# Patient Record
Sex: Female | Born: 1990 | Race: White | Hispanic: No | Marital: Married | Smoking: Former smoker
Health system: Southern US, Community
[De-identification: ages and names within clinical notes are randomized; demographics above are authoritative.]

## PROBLEM LIST (undated history)

## (undated) DIAGNOSIS — J45909 Unspecified asthma, uncomplicated: Secondary | ICD-10-CM

## (undated) DIAGNOSIS — A770 Spotted fever due to Rickettsia rickettsii: Secondary | ICD-10-CM

## (undated) DIAGNOSIS — T781XXA Other adverse food reactions, not elsewhere classified, initial encounter: Secondary | ICD-10-CM

## (undated) DIAGNOSIS — T7819XA Other adverse food reactions, not elsewhere classified, initial encounter: Secondary | ICD-10-CM

## (undated) HISTORY — DX: Spotted fever due to Rickettsia rickettsii: A77.0

## (undated) HISTORY — DX: Other adverse food reactions, not elsewhere classified, initial encounter: T78.19XA

## (undated) HISTORY — DX: Other adverse food reactions, not elsewhere classified, initial encounter: T78.1XXA

## (undated) HISTORY — DX: Unspecified asthma, uncomplicated: J45.909

---
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
## 2011-08-03 ENCOUNTER — Ambulatory Visit (INDEPENDENT_AMBULATORY_CARE_PROVIDER_SITE_OTHER): Payer: PRIVATE HEALTH INSURANCE | Admitting: Internal Medicine

## 2011-08-03 ENCOUNTER — Encounter: Payer: Self-pay | Admitting: Internal Medicine

## 2011-08-03 DIAGNOSIS — J45990 Exercise induced bronchospasm: Secondary | ICD-10-CM | POA: Insufficient documentation

## 2011-08-03 DIAGNOSIS — J45909 Unspecified asthma, uncomplicated: Secondary | ICD-10-CM

## 2011-08-03 DIAGNOSIS — N943 Premenstrual tension syndrome: Secondary | ICD-10-CM

## 2011-08-03 MED ORDER — MONTELUKAST SODIUM 10 MG PO TABS: 10.0000 mg | ORAL_TABLET | Freq: Every day | ORAL | Status: DC

## 2011-08-03 MED ORDER — METHOCARBAMOL 500 MG PO TABS: 500.0000 mg | ORAL_TABLET | Freq: Four times a day (QID) | ORAL | Status: AC | PRN

## 2011-08-03 MED ORDER — BUDESONIDE-FORMOTEROL FUMARATE 160-4.5 MCG/ACT IN AERO: 2.0000 | INHALATION_SPRAY | Freq: Two times a day (BID) | RESPIRATORY_TRACT | Status: DC

## 2011-08-03 MED ORDER — PEAK FLOW METER DEVI: 1.0000 | Freq: Every day | Status: DC

## 2011-08-03 NOTE — Assessment & Plan Note (Signed)
With constipation, bloating followed by diarrhea and severe cramping for one day.  Recommended trial of Vitamin E, fiber laxatives.

## 2011-08-03 NOTE — Progress Notes (Signed)
Patient ID: Robin Blankenship, female   DOB: Jul 05, 1990, 21 y.o.   MRN: [id number]   Patient Active Problem List  Diagnoses  . Asthma  . Premenstrual syndrome    Subjective:  CC:   Chief Complaint  Patient presents with  . Asthma  . Follow-up    HPI:   Robin Elliottis a 21 y.o. female who presents for follow up on asthma, premenstrual dysphoria and back pain.  Studying business law,  Taking 2 courses at [employer] workload manageable. 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  Some back pain ,  Seeing  chiropractor Dr. Tobe Sos)  For manipulation and PT.  No history of injury or or scoliosis. Cc Menstrual pain, not every period, but every 2 or 3 times get diarrhea, nausea and horrible pain for 1-2 days.  Chronic issue.    2nd issue is asthma,  No prior PFTs .  Symptoms are getting worse, since the winter has used inhaler 30 times in the last month    No past medical history on file.  No past surgical history on file.       The following portions of the patient's history were reviewed and updated as appropriate: Allergies, current medications, and problem list.    Review of Systems:   12 Pt  review of systems was negative except those addressed in the HPI,     History   Social History  . Marital Status: Single    Spouse Name: N/A    Number of Children: N/A  . Years of Education: N/A   Occupational History  . Not on file.   Social History Main Topics  . Smoking status: Current Some Day Smoker    Types: Cigarettes  . Smokeless tobacco: Never Used  . Alcohol Use: Yes  . Drug Use: No  . Sexually Active: Not on file   Other Topics Concern  . Not on file   Social History Narrative  . No narrative on file    Objective:  BP 96/58  Pulse 80  Temp(Src) 98 F (36.7 C) (Oral)  Resp 16  Ht 5\' 4"  (1.626 m)  Wt 115 lb (52.164 kg)  BMI 19.74 kg/m2  SpO2 97%  LMP 07/26/2011  General appearance: alert, cooperative and appears stated age Ears: normal  TM's and external ear canals both ears Throat: lips, mucosa, and tongue normal; teeth and gums normal Neck: no adenopathy, no carotid bruit, supple, symmetrical, trachea midline and thyroid not enlarged, symmetric, no tenderness/mass/nodules Back: symmetric, no curvature. ROM normal. No CVA tenderness. Lungs: clear to auscultation bilaterally Heart: regular rate and rhythm, S1, S2 normal, no murmur, click, rub or gallop Abdomen: soft, non-tender; bowel sounds normal; no masses,  no organomegaly Pulses: 2+ and symmetric Skin: Skin color, texture, turgor normal. No rashes or lesions Lymph nodes: Cervical, supraclavicular, and axillary nodes normal.  Assessment and Plan:  Asthma Patient asymptomatic currently, ,with normal lung exam. But given recurrent use of albuterol I am Starting Symbicort and Singulair for  increased symptoms. Peak flow meter prescribed.   Premenstrual syndrome With constipation, bloating followed by diarrhea and severe cramping for one day.  Recommended trial of Vitamin E, fiber laxatives.     Updated Medication List Outpatient Encounter Prescriptions as of 08/03/2011  Medication Sig Dispense Refill  . albuterol (PROVENTIL HFA;VENTOLIN HFA) 108 (90 BASE) MCG/ACT inhaler Inhale 2 puffs into the lungs every 6 (six) hours as needed.      Marland Kitchen BIOTIN 5000 PO Take by mouth.      Marland Kitchen  budesonide-formoterol (SYMBICORT) 160-4.5 MCG/ACT inhaler Inhale 2 puffs into the lungs 2 (two) times daily.  1 Inhaler  12  . methocarbamol (ROBAXIN) 500 MG tablet Take 1 tablet (500 mg total) by mouth every 6 (six) hours as needed.  30 tablet  1  . montelukast (SINGULAIR) 10 MG tablet Take 1 tablet (10 mg total) by mouth at bedtime.  30 tablet  3  . Peak Flow Meter DEVI 1 Device by Does not apply route daily.  1 each  0     No orders of the defined types were placed in this encounter.    No Follow-up on file.      '

## 2011-08-03 NOTE — Assessment & Plan Note (Signed)
Patient asymptomatic currently, ,with normal lung exam. But given recurrent use of albuterol I am Starting Symbicort and Singulair for  increased symptoms. Peak flow meter prescribed.

## 2011-08-03 NOTE — Patient Instructions (Addendum)
I recommend starting citrucel one week before your period .  Take at bedtime .  It is a gentle fiber laxative and works overnight.    For the terrible days of cramping you can try a muscle relaxer when you are at home.  Robaxin (methocarbamol)  1/2 tablet every 6 hours as needed.    You can try taking 400 units of Vitamin E (some gyn docs recommend this)  For your asthma:  I am startgin two new mewds to prevent attacks:  Singulair : one pill daily  (for asthma and allergies  Symbicort:  2 puffs twice daily (to prevent astham attacks)  As your maintenance inhaler   I would like you to start to check your peak flows once daily in the morning to help monitor you for signs of impending asthma exacerbation

## 2011-09-20 ENCOUNTER — Encounter: Payer: Self-pay | Admitting: Internal Medicine

## 2011-09-20 ENCOUNTER — Ambulatory Visit (INDEPENDENT_AMBULATORY_CARE_PROVIDER_SITE_OTHER): Payer: BC Managed Care – PPO | Admitting: Internal Medicine

## 2011-09-20 DIAGNOSIS — L0591 Pilonidal cyst without abscess: Secondary | ICD-10-CM

## 2011-09-20 MED ORDER — BUDESONIDE-FORMOTEROL FUMARATE 160-4.5 MCG/ACT IN AERO: 2.0000 | INHALATION_SPRAY | Freq: Two times a day (BID) | RESPIRATORY_TRACT | Status: DC

## 2011-09-20 MED ORDER — SULFAMETHOXAZOLE-TRIMETHOPRIM 800-160 MG PO TABS: 1.0000 | ORAL_TABLET | Freq: Two times a day (BID) | ORAL | Status: AC

## 2011-09-20 NOTE — Patient Instructions (Signed)
Pilonidal Cyst A pilonidal cyst occurs when hairs get trapped (ingrown) beneath the skin in the crease between the buttocks over your sacrum (the bone under that crease). Pilonidal cysts are most common in young men with a lot of body hair. When the cyst is ruptured (breaks) or leaking, fluid from the cyst may cause burning and itching. If the cyst becomes infected, it causes a painful swelling filled with pus (abscess). The pus and trapped hairs need to be removed (often by lancing) so that the infection can heal. However, recurrence is common and an operation may be needed to remove the cyst. HOME CARE INSTRUCTIONS   If the cyst was NOT INFECTED:   Keep the area clean and dry. Bathe or shower daily. Wash the area well with a germ-killing soap. Warm tub baths may help prevent infection and help with drainage. Dry the area well with a towel.   Avoid tight clothing to keep area as moisture free as possible.   Keep area between buttocks as free of hair as possible. A depilatory may be used.   If the cyst WAS INFECTED and needed to be drained:   Your caregiver packed the wound with gauze to keep the wound open. This allows the wound to heal from the inside outwards and continue draining.   Return for a wound check in 1 day or as suggested.   If you take tub baths or showers, repack the wound with gauze following them. Sponge baths (at the sink) are a good alternative.   If an antibiotic was ordered to fight the infection, take as directed.   Only take over-the-counter or prescription medicines for pain, discomfort, or fever as directed by your caregiver.   After the drain is removed, use sitz baths for 20 minutes 4 times per day. Clean the wound gently with mild unscented soap, pat dry, and then apply a dry dressing.  SEEK MEDICAL CARE IF:   You have increased pain, swelling, redness, drainage, or bleeding from the area.   You have a fever.   You have muscles aches, dizziness, or a  general ill feeling.  Document Released: 04/16/2000 Document Revised: 04/08/2011 Document Reviewed: 06/14/2008 Exit[employer] Patient Information 2012 [employer]. 

## 2011-09-21 DIAGNOSIS — L0591 Pilonidal cyst without abscess: Secondary | ICD-10-CM | POA: Insufficient documentation

## 2011-09-21 NOTE — Assessment & Plan Note (Signed)
There are no signs of abscess  So there is no indication for antibiotics. But will give her an rx for future use since he is going out of town. Referral to Dr. Lemar Livings .

## 2011-09-21 NOTE — Progress Notes (Signed)
Patient ID: Robin Blankenship, female   DOB: 24-Jun-1990, 21 y.o.   MRN: [id number] Patient Active Problem List  Diagnoses  . Asthma  . Premenstrual syndrome  . Pilonidal cyst    Subjective:  CC:   Chief Complaint  Patient presents with  . cyst at end of spine not draining    HPI:   Robin Blankenship a 21 y.o. female who presents  Swelling and tenderness at the base of her spine , which started bout four days ago after a long car ride. Marland Kitchen  History of prior pilonidal cyst, excised years ago by dr. Kennyth Arnold.  No redness , drainage or systemic symptoms.   No past medical history on file.  No past surgical history on file.       The following portions of the patient's history were reviewed and updated as appropriate: Allergies, current medications, and problem list.    Review of Systems:   12 Pt  review of systems was negative except those addressed in the HPI,     History   Social History  . Marital Status: Single    Spouse Name: N/A    Number of Children: N/A  . Years of Education: N/A   Occupational History  . Not on file.   Social History Main Topics  . Smoking status: Former Smoker    Types: Cigarettes  . Smokeless tobacco: Former Neurosurgeon    Quit date: 09/18/2011  . Alcohol Use: Yes  . Drug Use: No  . Sexually Active: Not on file   Other Topics Concern  . Not on file   Social History Narrative  . No narrative on file    Objective:  BP 117/75  Pulse 78  Temp(Src) 98.9 F (37.2 C) (Oral)  Resp 20  Ht 5\' 4"  (1.626 m)  Wt 109 lb 12 oz (49.782 kg)  BMI 18.84 kg/m2  SpO2 100%  General appearance: alert, cooperative and appears stated age Ears: normal TM's and external ear canals both ears Throat: lips, mucosa, and tongue normal; teeth and gums normal Neck: no adenopathy, no carotid bruit, supple, symmetrical, trachea midline and thyroid not enlarged, symmetric, no tenderness/mass/nodules Back: symmetric, no curvature. ROM normal. No CVA  tenderness. Lungs: clear to auscultation bilaterally Heart: regular rate and rhythm, S1, S2 normal, no murmur, click, rub or gallop Abdomen: soft, non-tender; bowel sounds normal; no masses,  no organomegaly Rectal: perirectal swelling Pulses: 2+ and symmetric Skin: Skin color, texture, turgor normal. No rashes or lesions Lymph nodes: Cervical, supraclavicular, and axillary nodes normal.  Assessment and Plan:  Pilonidal cyst There are no signs of abscess  So there is no indication for antibiotics. But will give her an rx for future use since he is going out of town. Referral to Dr. Lemar Livings .     Updated Medication List Outpatient Encounter Prescriptions as of 09/20/2011  Medication Sig Dispense Refill  . albuterol (PROVENTIL HFA;VENTOLIN HFA) 108 (90 BASE) MCG/ACT inhaler Inhale 2 puffs into the lungs every 6 (six) hours as needed.      Marland Kitchen BIOTIN 5000 PO Take by mouth.      . montelukast (SINGULAIR) 10 MG tablet Take 1 tablet (10 mg total) by mouth at bedtime.  30 tablet  3  . Peak Flow Meter DEVI 1 Device by Does not apply route daily.  1 each  0  . budesonide-formoterol (SYMBICORT) 160-4.5 MCG/ACT inhaler Inhale 2 puffs into the lungs 2 (two) times daily.  1 Inhaler  12  . sulfamethoxazole-trimethoprim (BACTRIM  DS,SEPTRA DS) 800-160 MG per tablet Take 1 tablet by mouth 2 (two) times daily.  20 tablet  0  . DISCONTD: budesonide-formoterol (SYMBICORT) 160-4.5 MCG/ACT inhaler Inhale 2 puffs into the lungs 2 (two) times daily.  1 Inhaler  12     Orders Placed This Encounter  Procedures  . Ambulatory referral to General Surgery    No Follow-up on file.

## 2012-07-24 ENCOUNTER — Ambulatory Visit (INDEPENDENT_AMBULATORY_CARE_PROVIDER_SITE_OTHER): Payer: 59 | Admitting: Internal Medicine

## 2012-07-24 ENCOUNTER — Encounter: Payer: Self-pay | Admitting: Internal Medicine

## 2012-07-24 VITALS — BP 114/70 | HR 66 | Temp 98.6°F | Resp 16 | Wt 116.5 lb

## 2012-07-24 DIAGNOSIS — N943 Premenstrual tension syndrome: Secondary | ICD-10-CM

## 2012-07-24 DIAGNOSIS — R5383 Other fatigue: Secondary | ICD-10-CM

## 2012-07-24 DIAGNOSIS — Z13220 Encounter for screening for lipoid disorders: Secondary | ICD-10-CM

## 2012-07-24 DIAGNOSIS — Z309 Encounter for contraceptive management, unspecified: Secondary | ICD-10-CM

## 2012-07-24 DIAGNOSIS — R5381 Other malaise: Secondary | ICD-10-CM

## 2012-07-24 MED ORDER — NORGESTREL-ETHINYL ESTRADIOL 0.3-30 MG-MCG PO TABS: 1.0000 | ORAL_TABLET | Freq: Every day | ORAL | Status: DC

## 2012-07-24 NOTE — Progress Notes (Signed)
Patient ID: Robin Blankenship, female   DOB: 05/10/1990, 22 y.o.   MRN: [id number]   Patient Active Problem List  Diagnosis  . Asthma  . Premenstrual syndrome  . Pilonidal cyst  . Contraceptive management    Subjective:  CC:   Chief Complaint  Patient presents with  . Contraceptives    Discuss Birth Control    HPI:   Robin Elliottis a 22 y.o. female who presents To discuss oral contraception. She is currently is sexually active, in a monogamous relationship, using contraception and wants to discuss choices of birth control. She had previously used oral contraceptives as a way to regulate her periods and to reduce her pain from fall follicular maturation. She was not sexually active at the previous trial. Currently she is going to school and working full-time. 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  She is no history of tobacco use or blood clots. There is no family history of stroke or blood clots either.     History reviewed. No pertinent past medical history.  History reviewed. No pertinent past surgical history.     The following portions of the patient's history were reviewed and updated as appropriate: Allergies, current medications, and problem list.    Review of Systems:   12 Pt  review of systems was negative except those addressed in the HPI,     History   Social History  . Marital Status: Single    Spouse Name: N/A    Number of Children: N/A  . Years of Education: N/A   Occupational History  . Not on file.   Social History Main Topics  . Smoking status: Former Smoker    Types: Cigarettes  . Smokeless tobacco: Former Neurosurgeon    Quit date: 09/18/2011  . Alcohol Use: Yes  . Drug Use: No  . Sexually Active: Not on file   Other Topics Concern  . Not on file   Social History Narrative  . No narrative on file    Objective:  BP 114/70  Pulse 66  Temp(Src) 98.6 F (37 C) (Oral)  Resp 16  Wt 116 lb 8 oz  (52.844 kg)  BMI 19.99 kg/m2  SpO2 98%  LMP 07/19/2012  General appearance: alert, cooperative and appears stated age Ears: normal TM's and external ear canals both ears Throat: lips, mucosa, and tongue normal; teeth and gums normal Neck: no adenopathy, no carotid bruit, supple, symmetrical, trachea midline and thyroid not enlarged, symmetric, no tenderness/mass/nodules Back: symmetric, no curvature. ROM normal. No CVA tenderness. Lungs: clear to auscultation bilaterally Heart: regular rate and rhythm, S1, S2 normal, no murmur, click, rub or gallop Abdomen: soft, non-tender; bowel sounds normal; no masses,  no organomegaly Pulses: 2+ and symmetric Skin: Skin color, texture, turgor normal. No rashes or lesions Lymph nodes: Cervical, supraclavicular, and axillary nodes normal.  Assessment and Plan:  Premenstrual syndrome She has significant premenstrual pain due to follicular maturation. We discussed starting birth control as a way to help this.  Contraceptive management Education given regarding options for contraception, including barrier methods, injectable contraception, oral contraceptives..  patient has a son she is on contraceptives. She is advised to continue to use barrier contraceptives for the first month and whenever she is on antibiotics for remainder of that month.   Updated Medication List Outpatient Encounter Prescriptions as of 07/24/2012  Medication Sig Dispense Refill  . albuterol (PROVENTIL HFA;VENTOLIN HFA) 108 (90 BASE) MCG/ACT inhaler Inhale 2 puffs into the lungs every 6 (six)  hours as needed.      Marland Kitchen BIOTIN 5000 PO Take by mouth.      . budesonide-formoterol (SYMBICORT) 160-4.5 MCG/ACT inhaler Inhale 2 puffs into the lungs 2 (two) times daily.  1 Inhaler  12  . Peak Flow Meter DEVI 1 Device by Does not apply route daily.  1 each  0  . montelukast (SINGULAIR) 10 MG tablet Take 1 tablet (10 mg total) by mouth at bedtime.  30 tablet  3  . norgestrel-ethinyl  estradiol (LO/OVRAL,CRYSELLE) 0.3-30 MG-MCG tablet Take 1 tablet by mouth daily.  1 Package  11   No facility-administered encounter medications on file as of 07/24/2012.     Orders Placed This Encounter  Procedures  . CBC with Differential  . Comprehensive metabolic panel  . TSH  . Lipid panel    No Follow-up on file.

## 2012-07-24 NOTE — Patient Instructions (Addendum)
Return for labs this week before you start the birth control.    We will need to recheck your liver enzymes after a few months to make sure the pills have not addected your liver.

## 2012-07-25 ENCOUNTER — Encounter: Payer: Self-pay | Admitting: Internal Medicine

## 2012-07-25 DIAGNOSIS — Z309 Encounter for contraceptive management, unspecified: Secondary | ICD-10-CM | POA: Insufficient documentation

## 2012-07-25 NOTE — Assessment & Plan Note (Signed)
Education given regarding options for contraception, including barrier methods, injectable contraception, oral contraceptives..  patient has a son she is on contraceptives. She is advised to continue to use barrier contraceptives for the first month and whenever she is on antibiotics for remainder of that month.

## 2012-07-25 NOTE — Assessment & Plan Note (Signed)
She has significant premenstrual pain due to follicular maturation. We discussed starting birth control as a way to help this.

## 2012-07-26 ENCOUNTER — Other Ambulatory Visit (INDEPENDENT_AMBULATORY_CARE_PROVIDER_SITE_OTHER): Payer: 59

## 2012-07-26 ENCOUNTER — Encounter: Payer: Self-pay | Admitting: Internal Medicine

## 2012-07-26 DIAGNOSIS — R5381 Other malaise: Secondary | ICD-10-CM

## 2012-07-26 DIAGNOSIS — Z13220 Encounter for screening for lipoid disorders: Secondary | ICD-10-CM

## 2012-07-26 DIAGNOSIS — R5383 Other fatigue: Secondary | ICD-10-CM

## 2012-07-26 LAB — COMPREHENSIVE METABOLIC PANEL
Albumin: 4.5 g/dL (ref 3.5–5.2)
Alkaline Phosphatase: 59 U/L (ref 39–117)
BUN: 12 mg/dL (ref 6–23)
CO2: 24 mEq/L (ref 19–32)
GFR: 94.73 mL/min (ref >60.00)
Glucose, Bld: 86 mg/dL (ref 70–99)
Potassium: 4.1 mEq/L (ref 3.5–5.1)
Total Bilirubin: 1 mg/dL (ref 0.3–1.2)

## 2012-07-26 LAB — CBC WITH DIFFERENTIAL/PLATELET
Basophils Relative: 0.8 % (ref 0.0–3.0)
Eosinophils Relative: 3.9 % (ref 0.0–5.0)
HCT: 40.9 % (ref 36.0–46.0)
Hemoglobin: 14 g/dL (ref 12.0–15.0)
Lymphs Abs: 1.3 10*3/uL (ref 0.7–4.0)
MCV: 90.2 fl (ref 78.0–100.0)
Monocytes Absolute: 0.4 10*3/uL (ref 0.1–1.0)
Monocytes Relative: 8 % (ref 3.0–12.0)
RBC: 4.53 Mil/uL (ref 3.87–5.11)
WBC: 4.5 10*3/uL (ref 4.5–10.5)

## 2012-07-26 LAB — LIPID PANEL
Cholesterol: 186 mg/dL (ref 0–200)
VLDL: 7.4 mg/dL (ref 0.0–40.0)

## 2012-07-26 LAB — TSH: TSH: 1.2 u[IU]/mL (ref 0.35–5.50)

## 2012-07-28 ENCOUNTER — Encounter: Payer: Self-pay | Admitting: General Practice

## 2012-08-22 ENCOUNTER — Encounter: Payer: Self-pay | Admitting: Internal Medicine

## 2013-03-08 ENCOUNTER — Other Ambulatory Visit: Payer: Self-pay

## 2013-10-05 ENCOUNTER — Ambulatory Visit: Payer: 59 | Admitting: Internal Medicine

## 2013-10-22 ENCOUNTER — Ambulatory Visit (INDEPENDENT_AMBULATORY_CARE_PROVIDER_SITE_OTHER): Payer: Self-pay | Admitting: Internal Medicine

## 2013-10-22 DIAGNOSIS — Z0000 Encounter for general adult medical examination without abnormal findings: Secondary | ICD-10-CM

## 2013-10-23 DIAGNOSIS — Z0000 Encounter for general adult medical examination without abnormal findings: Secondary | ICD-10-CM | POA: Insufficient documentation

## 2013-10-23 NOTE — Progress Notes (Signed)
Patient ID: Robin Blankenship, female   DOB: 1990/12/08, 23 y.o.   MRN: [id number] Patient was scheduled for a 30 minute appt and DNKA/No showed.

## 2013-10-24 ENCOUNTER — Telehealth: Payer: Self-pay | Admitting: Internal Medicine

## 2013-10-24 NOTE — Telephone Encounter (Signed)
Please schedule patient and advise of appointment

## 2013-10-24 NOTE — Telephone Encounter (Signed)
The patient has been scheduled for 6.25.15 @ 11:45.

## 2013-10-24 NOTE — Telephone Encounter (Signed)
NO, I will not use that acute slot for this patient's need. Add her on this  Thursday at 11:45

## 2013-10-24 NOTE — Telephone Encounter (Signed)
Only place you have is an acute 6:15 on Monday, please advise. Patient no showed on 10/22/13

## 2013-10-24 NOTE — Telephone Encounter (Signed)
Patient notified of appointment time.

## 2013-10-24 NOTE — Telephone Encounter (Signed)
I asked the patient why she missed her last appointment and she stated that it totally slipped her mind. Needing a form filled out for college /physical and vaccines before July 1st.

## 2013-10-25 ENCOUNTER — Ambulatory Visit (INDEPENDENT_AMBULATORY_CARE_PROVIDER_SITE_OTHER): Payer: BC Managed Care – PPO | Admitting: Internal Medicine

## 2013-10-25 ENCOUNTER — Encounter: Payer: Self-pay | Admitting: Internal Medicine

## 2013-10-25 VITALS — BP 98/68 | HR 61 | Temp 97.9°F | Resp 16 | Ht 64.0 in | Wt 116.2 lb

## 2013-10-25 DIAGNOSIS — Z0279 Encounter for issue of other medical certificate: Secondary | ICD-10-CM

## 2013-10-25 DIAGNOSIS — Z0000 Encounter for general adult medical examination without abnormal findings: Secondary | ICD-10-CM

## 2013-10-25 DIAGNOSIS — Z112 Encounter for screening for other bacterial diseases: Secondary | ICD-10-CM

## 2013-10-25 DIAGNOSIS — Z118 Encounter for screening for other infectious and parasitic diseases: Secondary | ICD-10-CM

## 2013-10-25 NOTE — Progress Notes (Signed)
Pre-visit discussion using our clinic review tool. No additional management support is needed unless otherwise documented below in the visit note.  

## 2013-10-25 NOTE — Progress Notes (Signed)
Patient ID: Robin Blankenship, female   DOB: 05/02/1991, 23 y.o.   MRN: [id number]  Patient Active Problem List   Diagnosis Date Noted  . Routine general medical examination at a health care facility 10/23/2013  . Contraceptive management 07/25/2012  . Pilonidal cyst 09/21/2011  . Asthma 08/03/2011  . Premenstrual syndrome 08/03/2011    Subjective:  CC:   Chief Complaint  Patient presents with  . Follow-up    forms for school    HPI:   Robin Blankenship is a 23 y.o. female who presents for   Need for medical history and immunization confirmation. 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  Does not have her immunization records with her or available in chart. Pediatrician was Dr Stacey Drain    No past medical history on file.  No past surgical history on file.     The following portions of the patient's history were reviewed and updated as appropriate: Allergies, current medications, and problem list.    Review of Systems:   Patient denies headache, fevers, malaise, unintentional weight loss, skin rash, eye pain, sinus congestion and sinus pain, sore throat, dysphagia,  hemoptysis , cough, dyspnea, wheezing, chest pain, palpitations, orthopnea, edema, abdominal pain, nausea, melena, diarrhea, constipation, flank pain, dysuria, hematuria, urinary  Frequency, nocturia, numbness, tingling, seizures,  Focal weakness, Loss of consciousness,  Tremor, insomnia, depression, anxiety, and suicidal ideation.     History   Social History  . Marital Status: Single    Spouse Name: N/A    Number of Children: N/A  . Years of Education: N/A   Occupational History  . Not on file.   Social History Main Topics  . Smoking status: Former Smoker    Types: Cigarettes  . Smokeless tobacco: Former Systems developer    Quit date: 09/18/2011  . Alcohol Use: Yes  . Drug Use: No  . Sexual Activity: Not  on file   Other Topics Concern  . Not on file   Social History Narrative  . No narrative on file    Objective:  Filed Vitals:   10/25/13 1159  BP: 98/68  Pulse: 61  Temp: 97.9 F (36.6 C)  Resp: 16     General appearance: alert, cooperative and appears stated age Ears: normal TM's and external ear canals both ears Throat: lips, mucosa, and tongue normal; teeth and gums normal Neck: no adenopathy, no carotid bruit, supple, symmetrical, trachea midline and thyroid not enlarged, symmetric, no tenderness/mass/nodules Back: symmetric, no curvature. ROM normal. No CVA tenderness. Lungs: clear to auscultation bilaterally Heart: regular rate and rhythm, S1, S2 normal, no murmur, click, rub or gallop Abdomen: soft, non-tender; bowel sounds normal; no masses,  no organomegaly Pulses: 2+ and symmetric Skin: Skin color, texture, turgor normal. No rashes or lesions Lymph nodes: Cervical, supraclavicular, and axillary nodes normal.  Assessment and Plan:  Routine general medical examination at a health care facility Annual comprehensive exam was done excluding breast, pelvic and PAP smear. All immunizations have been reviewed and screenings have been addressed .   A total of 25 minutes was spent with patient more than half of which was spent in counseling, reviewing records from other prviders and coordination of care.   Updated Medication List Outpatient Encounter Prescriptions as of 10/25/2013  Medication Sig  . albuterol (PROVENTIL HFA;VENTOLIN HFA) 108 (90 BASE) MCG/ACT inhaler Inhale 2 puffs into the lungs every 6 (six) hours  as needed.  . Peak Flow Meter DEVI 1 Device by Does not apply route daily.  Marland Kitchen BIOTIN 5000 PO Take by mouth.  . budesonide-formoterol (SYMBICORT) 160-4.5 MCG/ACT inhaler Inhale 2 puffs into the lungs 2 (two) times daily.  . montelukast (SINGULAIR) 10 MG tablet Take 1 tablet (10 mg total) by mouth at bedtime.  . norgestrel-ethinyl estradiol  (LO/OVRAL,CRYSELLE) 0.3-30 MG-MCG tablet Take 1 tablet by mouth daily.     Orders Placed This Encounter  Procedures  . Quantiferon tb gold assay    No Follow-up on file.

## 2013-10-27 NOTE — Assessment & Plan Note (Signed)
Annual comprehensive exam was done excluding breast, pelvic and PAP smear. All immunizations have been reviewed and screenings have been addressed .

## 2013-10-29 ENCOUNTER — Telehealth: Payer: Self-pay | Admitting: Internal Medicine

## 2013-10-29 LAB — QUANTIFERON TB GOLD ASSAY (BLOOD)
Interferon Gamma Release Assay: NEGATIVE
Mitogen value: 8.14 IU/mL
QUANTIFERON NIL VALUE: 0.02 [IU]/mL
Quantiferon Tb Ag Minus Nil Value: 0 IU/mL
TB Ag value: 0.02 IU/mL

## 2013-10-29 NOTE — Telephone Encounter (Signed)
Paperwork waiting on TB test not resulted as of this date.

## 2013-10-30 ENCOUNTER — Encounter: Payer: Self-pay | Admitting: *Deleted

## 2013-10-30 NOTE — Telephone Encounter (Signed)
Added quantitated Gold test to paper work for TB testing faxed to college and notified patient ready for pick up and copy sent to billing.

## 2014-05-08 ENCOUNTER — Ambulatory Visit: Payer: BC Managed Care – PPO | Admitting: Internal Medicine

## 2014-05-13 ENCOUNTER — Ambulatory Visit (INDEPENDENT_AMBULATORY_CARE_PROVIDER_SITE_OTHER): Payer: 59 | Admitting: Nurse Practitioner

## 2014-05-13 ENCOUNTER — Encounter: Payer: Self-pay | Admitting: Nurse Practitioner

## 2014-05-13 VITALS — BP 118/70 | HR 101 | Temp 97.5°F | Resp 12 | Ht 64.0 in | Wt 119.8 lb

## 2014-05-13 DIAGNOSIS — J45990 Exercise induced bronchospasm: Secondary | ICD-10-CM

## 2014-05-13 DIAGNOSIS — M21612 Bunion of left foot: Principal | ICD-10-CM

## 2014-05-13 DIAGNOSIS — M2012 Hallux valgus (acquired), left foot: Secondary | ICD-10-CM

## 2014-05-13 DIAGNOSIS — M21611 Bunion of right foot: Secondary | ICD-10-CM

## 2014-05-13 DIAGNOSIS — M2011 Hallux valgus (acquired), right foot: Secondary | ICD-10-CM

## 2014-05-13 MED ORDER — ALBUTEROL SULFATE HFA 108 (90 BASE) MCG/ACT IN AERS: 2.0000 | INHALATION_SPRAY | Freq: Four times a day (QID) | RESPIRATORY_TRACT | Status: DC | PRN

## 2014-05-13 NOTE — Patient Instructions (Addendum)
We will contact you about your referral to Podiatry.   The refill for your inhaler was sent to [facility].   Call us if you need anything in the mean time.

## 2014-05-13 NOTE — Assessment & Plan Note (Signed)
Worsening. Painful feet bilaterally. Referral to Dr. Elvina Mattes podiatry.

## 2014-05-13 NOTE — Progress Notes (Signed)
   Subjective:    Patient ID: Robin Blankenship, female    DOB: [birth year], 24 y.o.   MRN: [id number]  HPI  Ms. Robin Blankenship is a 24 yo female with a CC of asthma and painful feet.   1) Asthma, no recent flare ups. Uses albuterol inhaler infrequently and needs a refill since she is planning to start exercising soon.   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 Pt states right foot has pain with walking on the pad and heel; Left foot has arch and toe pad pain with occasional heel pain. She states walking a lot makes it worse. She has extensive family history of the same feet deformities and pain. She is asking for a referral.   Review of Systems  Constitutional: Negative for fever, chills, diaphoresis and fatigue.  Musculoskeletal: Positive for gait problem. Negative for myalgias, joint swelling and arthralgias.       From painful feet when walking a lot  Skin: Negative for color change, pallor, rash and wound.   No past medical history on file.  History   Social History  . Marital Status: Single    Spouse Name: N/A    Number of Children: N/A  . Years of Education: N/A   Occupational History  . Not on file.   Social History Main Topics  . Smoking status: Former Smoker    Types: Cigarettes  . Smokeless tobacco: Former Systems developer    Quit date: 09/18/2011  . Alcohol Use: Yes  . Drug Use: No  . Sexual Activity: Not on file   Other Topics Concern  . Not on file   Social History Narrative    No past surgical history on file.  Family History  Problem Relation Age of Onset  . Heart disease Father   . Cancer Paternal Grandmother     breast   . Diabetes Paternal Grandmother     No Known Allergies  Current Outpatient Prescriptions on File Prior to Visit  Medication Sig Dispense Refill  . BIOTIN 5000 PO Take by mouth.    . Peak Flow Meter DEVI 1 Device by Does not apply route daily. 1 each 0   No current facility-administered medications on file prior to  visit.       Objective:   Physical Exam  Constitutional: She is oriented to person, place, and time. She appears well-developed and well-nourished. No distress.  HENT:  Head: Normocephalic and atraumatic.  Cardiovascular: Normal rate and regular rhythm.   Pulmonary/Chest: Effort normal and breath sounds normal.  Musculoskeletal: She exhibits no edema or tenderness.  Feet have bunions and valgus deformity of large toes. She is not tender on palpation today.   Neurological: She is alert and oriented to person, place, and time.  Skin: Skin is warm and dry. No rash noted. She is not diaphoretic.  Psychiatric: She has a normal mood and affect. Her behavior is normal. Judgment and thought content normal.   BP 118/70 mmHg  Pulse 101  Temp(Src) 97.5 F (36.4 C) (Oral)  Resp 12  Ht 5\' 4"  (1.626 m)  Wt 119 lb 12.8 oz (54.341 kg)  BMI 20.55 kg/m2  SpO2 94%  LMP  (Exact Date) Repeat pulse 86    Assessment & Plan:

## 2014-05-13 NOTE — Assessment & Plan Note (Signed)
Stable. 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 Refilled. Normal lung exam. FU prn.

## 2014-11-25 ENCOUNTER — Other Ambulatory Visit: Payer: Self-pay | Admitting: Internal Medicine

## 2014-11-25 NOTE — Telephone Encounter (Signed)
Please advise refill.  Last OV was with NP on 05/13/14 and last OV with you was 10/25/13.

## 2014-11-27 NOTE — Telephone Encounter (Signed)
Ok to refill,  Refill sent  

## 2016-10-12 ENCOUNTER — Encounter: Payer: Self-pay | Admitting: Family

## 2016-10-12 ENCOUNTER — Ambulatory Visit (INDEPENDENT_AMBULATORY_CARE_PROVIDER_SITE_OTHER): Payer: 59 | Admitting: Family

## 2016-10-12 VITALS — BP 104/60 | HR 100 | Temp 98.9°F | Ht 64.0 in | Wt 131.6 lb

## 2016-10-12 DIAGNOSIS — R197 Diarrhea, unspecified: Secondary | ICD-10-CM | POA: Diagnosis not present

## 2016-10-12 NOTE — Progress Notes (Signed)
Subjective:    Patient ID: Robin Blankenship, female    DOB: 06/06/1990, 26 y.o.   MRN: [id number]  CC: Robin Blankenship is a 26 y.o. female who presents today for an acute visit.    HPI: CC: diarrhea, vomiting for 6 days, intermittent. Diarrhea started on trip and then went away on trip. Day when got back, diarrhea came back. Diarrhea is worse in the morning, having 3-4 per day. Non bloody, non mucoid. No recent antibiotics.  Recently had been in [employer] [place].  Has been taking imodium with intermittent relief Endorses loss of appetite, stomach cramps, intermittent chills. Severity of stomach cramps 'has gone down.' Husband has as well however feeling better now.   Has been drinking fluids.       HISTORY:  No past medical history on file. No past surgical history on file. Family History  Problem Relation Age of Onset  . Heart disease Father   . Cancer Paternal Grandmother        breast   . Diabetes Paternal Grandmother     Allergies: Patient has no known allergies. Current Outpatient Prescriptions on File Prior to Visit  Medication Sig Dispense Refill  . albuterol (PROVENTIL,VENTOLIN) 90 MCG/ACT inhaler USE TWO ORAL INHALATIONS EVERY FOUR TO SIX HOURS AS NEEDED BEFORE EXERCISE 17 each 5   No current facility-administered medications on file prior to visit.     Social History  Substance Use Topics  . Smoking status: Former Smoker    Types: Cigarettes  . Smokeless tobacco: Former Systems developer    Quit date: 09/18/2011  . Alcohol use Yes    Review of Systems  Constitutional: Negative for chills and fever.  Respiratory: Negative for cough.   Cardiovascular: Negative for chest pain and palpitations.  Gastrointestinal: Positive for abdominal pain, diarrhea, nausea and vomiting. Negative for abdominal distention, blood in stool and constipation.      Objective:    BP 104/60   Pulse 100   Temp 98.9 F (37.2 C) (Oral)   Ht 5\' 4"  (1.626 m)   Wt 131 lb  9.6 oz (59.7 kg)   LMP 09/25/2016 (Exact Date)   SpO2 98%   BMI 22.59 kg/m    Physical Exam  Constitutional: She appears well-developed and well-nourished.  Eyes: Conjunctivae are normal.  Cardiovascular: Normal rate, regular rhythm, normal heart sounds and normal pulses.   Pulmonary/Chest: Effort normal and breath sounds normal. She has no wheezes. She has no rhonchi. She has no rales.  Abdominal: Soft. Normal appearance and bowel sounds are normal. She exhibits no distension, no fluid wave, no ascites and no mass. There is generalized tenderness. There is no rigidity, no rebound, no guarding and no CVA tenderness.  Generalized mild tenderness, not exquisite. No focal tenderness.   Neurological: She is alert.  Skin: Skin is warm and dry.  Psychiatric: She has a normal mood and affect. Her speech is normal and behavior is normal. Thought content normal.  Vitals reviewed.      Assessment & Plan:   1. Diarrhea, unspecified type Afebrile. Patient is well-appearing. Patient exhibited mild tenderness, generalized over her abdomen which I would anticipate after days of diarrhea. No severe abdominal pain, fever, focal abdominal pain. Working diagnosis of viral gastritis.  No blood, mucous to suggest shigella. Pending stool cultures. Return precautions and red flag symptoms discussed at length with patient. Advised against any antimotility agents.   I have discontinued Robin Blankenship's BIOTIN 5000 PO and Peak Flow Meter.  I am also having her maintain her albuterol.   No orders of the defined types were placed in this encounter.   Return precautions given.   Risks, benefits, and alternatives of the medications and treatment plan prescribed today were discussed, and patient expressed understanding.   Education regarding symptom management and diagnosis given to patient on AVS.  Continue to follow with Crecencio Mc, MD for routine health maintenance.   Robin Blankenship and I agreed  with plan.   Mable Paris, FNP

## 2016-10-12 NOTE — Patient Instructions (Addendum)
  Stool cultures  As discussed-  Probiotics ( yogurt) if doesn't make diarrhea worse to re colonize gut  antimotility agents ( imodium, pepto bismol) can mask the amount of fluid lost into the intestine. Additionally, there is concern that they can prolong illness.. These agents should be discontinued promptly if abdominal pain develops, other symptoms worsen, or diarrhea persists  Please don't hestitate to let me know how you are doing while we are awaiting cultures.

## 2016-10-12 NOTE — Progress Notes (Signed)
Pre visit review using our clinic review tool, if applicable. No additional management support is needed unless otherwise documented below in the visit note. 

## 2016-10-12 NOTE — Addendum Note (Signed)
Addended by: Leeanne Rio on: 10/12/2016 03:49 PM   Modules accepted: Orders

## 2016-10-13 LAB — OVA AND PARASITE EXAMINATION: OP: NONE SEEN

## 2016-10-16 ENCOUNTER — Encounter: Payer: Self-pay | Admitting: Family

## 2016-11-05 ENCOUNTER — Encounter: Payer: Self-pay | Admitting: Family

## 2016-12-29 ENCOUNTER — Ambulatory Visit: Payer: 59

## 2018-07-28 ENCOUNTER — Other Ambulatory Visit: Payer: Self-pay | Admitting: Internal Medicine

## 2018-07-28 NOTE — Telephone Encounter (Signed)
Patient is now married and has a new last name and new insurance. Will need to be called for an appointment has not been seen since 2018

## 2018-08-07 NOTE — Telephone Encounter (Signed)
LMTCB. Need to schedule pt for a follow up with Dr. Tullo. 

## 2019-08-06 ENCOUNTER — Encounter: Payer: Self-pay | Admitting: Certified Nurse Midwife

## 2019-09-10 ENCOUNTER — Encounter: Payer: Self-pay | Admitting: Certified Nurse Midwife

## 2019-09-10 ENCOUNTER — Other Ambulatory Visit: Payer: Self-pay

## 2019-09-10 ENCOUNTER — Other Ambulatory Visit (HOSPITAL_COMMUNITY)
Admission: RE | Admit: 2019-09-10 | Discharge: 2019-09-10 | Disposition: A | Discharge status: Home / Self Care | Payer: BC Managed Care – PPO | Source: Ambulatory Visit | Attending: Certified Nurse Midwife | Admitting: Certified Nurse Midwife

## 2019-09-10 ENCOUNTER — Ambulatory Visit (INDEPENDENT_AMBULATORY_CARE_PROVIDER_SITE_OTHER): Payer: BC Managed Care – PPO | Admitting: Certified Nurse Midwife

## 2019-09-10 VITALS — BP 117/75 | HR 74 | Ht 64.0 in | Wt 143.3 lb

## 2019-09-10 DIAGNOSIS — Z13220 Encounter for screening for lipoid disorders: Secondary | ICD-10-CM | POA: Diagnosis not present

## 2019-09-10 DIAGNOSIS — Z124 Encounter for screening for malignant neoplasm of cervix: Secondary | ICD-10-CM | POA: Diagnosis present

## 2019-09-10 DIAGNOSIS — Z136 Encounter for screening for cardiovascular disorders: Secondary | ICD-10-CM

## 2019-09-10 DIAGNOSIS — Z01419 Encounter for gynecological examination (general) (routine) without abnormal findings: Secondary | ICD-10-CM

## 2019-09-10 NOTE — Patient Instructions (Signed)
Preventive Care 21-28 Years Old, Female Preventive care refers to visits with your health care provider and lifestyle choices that can promote health and wellness. This includes:  A yearly physical exam. This may also be called an annual well check.  Regular dental visits and eye exams.  Immunizations.  Screening for certain conditions.  Healthy lifestyle choices, such as eating a healthy diet, getting regular exercise, not using drugs or products that contain nicotine and tobacco, and limiting alcohol use. What can I expect for my preventive care visit? Physical exam Your health care provider will check your:  Height and weight. This may be used to calculate body mass index (BMI), which tells if you are at a healthy weight.  Heart rate and blood pressure.  Skin for abnormal spots. Counseling Your health care provider may ask you questions about your:  Alcohol, tobacco, and drug use.  Emotional well-being.  Home and relationship well-being.  Sexual activity.  Eating habits.  Work and work environment.  Method of birth control.  Menstrual cycle.  Pregnancy history. What immunizations do I need?  Influenza (flu) vaccine  This is recommended every year. Tetanus, diphtheria, and pertussis (Tdap) vaccine  You may need a Td booster every 10 years. Varicella (chickenpox) vaccine  You may need this if you have not been vaccinated. Human papillomavirus (HPV) vaccine  If recommended by your health care provider, you may need three doses over 6 months. Measles, mumps, and rubella (MMR) vaccine  You may need at least one dose of MMR. You may also need a second dose. Meningococcal conjugate (MenACWY) vaccine  One dose is recommended if you are age 19-21 years and a first-year college student living in a residence hall, or if you have one of several medical conditions. You may also need additional booster doses. Pneumococcal conjugate (PCV13) vaccine  You may need  this if you have certain conditions and were not previously vaccinated. Pneumococcal polysaccharide (PPSV23) vaccine  You may need one or two doses if you smoke cigarettes or if you have certain conditions. Hepatitis A vaccine  You may need this if you have certain conditions or if you travel or work in places where you may be exposed to hepatitis A. Hepatitis B vaccine  You may need this if you have certain conditions or if you travel or work in places where you may be exposed to hepatitis B. Haemophilus influenzae type b (Hib) vaccine  You may need this if you have certain conditions. You may receive vaccines as individual doses or as more than one vaccine together in one shot (combination vaccines). Talk with your health care provider about the risks and benefits of combination vaccines. What tests do I need?  Blood tests  Lipid and cholesterol levels. These may be checked every 5 years starting at age 20.  Hepatitis C test.  Hepatitis B test. Screening  Diabetes screening. This is done by checking your blood sugar (glucose) after you have not eaten for a while (fasting).  Sexually transmitted disease (STD) testing.  BRCA-related cancer screening. This may be done if you have a family history of breast, ovarian, tubal, or peritoneal cancers.  Pelvic exam and Pap test. This may be done every 3 years starting at age 21. Starting at age 30, this may be done every 5 years if you have a Pap test in combination with an HPV test. Talk with your health care provider about your test results, treatment options, and if necessary, the need for more tests.   Follow these instructions at home: Eating and drinking   Eat a diet that includes fresh fruits and vegetables, whole grains, lean protein, and low-fat dairy.  Take vitamin and mineral supplements as recommended by your health care provider.  Do not drink alcohol if: ? Your health care provider tells you not to drink. ? You are  pregnant, may be pregnant, or are planning to become pregnant.  If you drink alcohol: ? Limit how much you have to 0-1 drink a day. ? Be aware of how much alcohol is in your drink. In the [place] one drink equals one 12 oz bottle of beer (355 mL), one 5 oz glass of wine (148 mL), or one 1 oz glass of hard liquor (44 mL). Lifestyle  Take daily care of your teeth and gums.  Stay active. Exercise for at least 30 minutes on 5 or more days each week.  Do not use any products that contain nicotine or tobacco, such as cigarettes, e-cigarettes, and chewing tobacco. If you need help quitting, ask your health care provider.  If you are sexually active, practice safe sex. Use a condom or other form of birth control (contraception) in order to prevent pregnancy and STIs (sexually transmitted infections). If you plan to become pregnant, see your health care provider for a preconception visit. What's next?  Visit your health care provider once a year for a well check visit.  Ask your health care provider how often you should have your eyes and teeth checked.  Stay up to date on all vaccines. This information is not intended to replace advice given to you by your health care provider. Make sure you discuss any questions you have with your health care provider. Document Revised: 12/29/2017 Document Reviewed: 12/29/2017 Elsevier Patient Education  2020 [employer].

## 2019-09-10 NOTE — Progress Notes (Signed)
GYNECOLOGY ANNUAL PREVENTATIVE CARE ENCOUNTER NOTE  History:     Robin Blankenship is a 29 y.o. G0P0000 female here for a routine annual gynecologic exam.  Current complaints: none.   Denies abnormal vaginal bleeding, discharge, pelvic pain, problems with intercourse or other gynecologic concerns.     Social  Relationship: married Living: with husband Work: Data processing manager Exercise: none outside of work Alcohol/drink/Drugs: No smoking or drugs. Alcohol 2 glasses wine nightly   Gynecologic History Patient's last menstrual period was 08/31/2019 (exact date). Contraception: none Last Pap: has never had.  Last mammogram: n/a  Obstetric History OB History  Gravida Para Term Preterm AB Living  0 0 0 0 0 0  SAB TAB Ectopic Multiple Live Births  0 0 0 0 0    Past Medical History:  Diagnosis Date  . Asthma     No past surgical history on file.  Current Outpatient Medications on File Prior to Visit  Medication Sig Dispense Refill  . albuterol (VENTOLIN HFA) 108 (90 Base) MCG/ACT inhaler Inhale into the lungs every 6 (six) hours as needed for wheezing or shortness of breath.    . loratadine (CLARITIN) 10 MG tablet Take 10 mg by mouth daily.     No current facility-administered medications on file prior to visit.    No Known Allergies  Social History:  reports that she has quit smoking. Her smoking use included cigarettes. She quit smokeless tobacco use about 7 years ago. She reports current alcohol use. She reports that she does not use drugs.  Family History  Problem Relation Age of Onset  . Heart disease Father   . Diabetes Father   . Stroke Father   . Cancer Paternal Grandmother        breast   . Diabetes Paternal Grandmother   . Stroke Paternal Grandfather     The following portions of the patient's history were reviewed and updated as appropriate: allergies, current medications, past family history, past medical history, past  social history, past surgical history and problem list.  Review of Systems Pertinent items noted in HPI and remainder of comprehensive ROS otherwise negative.  Physical Exam:  BP 117/75   Pulse 74   Ht 5\' 4"  (1.626 m)   Wt 143 lb 5 oz (65 kg)   LMP 08/31/2019 (Exact Date)   BMI 24.60 kg/m  CONSTITUTIONAL: Well-developed, well-nourished female in no acute distress.  HENT:  Normocephalic, atraumatic, External right and left ear normal. Oropharynx is clear and moist EYES: Conjunctivae and EOM are normal. Pupils are equal, round, and reactive to light. No scleral icterus.  NECK: Normal range of motion, supple, no masses.  Normal thyroid.  SKIN: Skin is warm and dry. No rash noted. Not diaphoretic. No erythema. No pallor. MUSCULOSKELETAL: Normal range of motion. No tenderness.  No cyanosis, clubbing, or edema.  2+ distal pulses. NEUROLOGIC: Alert and oriented to person, place, and time. Normal reflexes, muscle tone coordination.  PSYCHIATRIC: Normal mood and affect. Normal behavior. Normal judgment and thought content. CARDIOVASCULAR: Normal heart rate noted, regular rhythm RESPIRATORY: Clear to auscultation bilaterally. Effort and breath sounds normal, no problems with respiration noted. BREASTS: Symmetric in size. No masses, tenderness, skin changes, nipple drainage, or lymphadenopathy bilaterally. Fibrocystic tissue right breast upper outer quadrant  ABDOMEN: Soft, no distention noted.  No tenderness, rebound or guarding.  PELVIC: Normal appearing external genitalia and urethral meatus; normal appearing vaginal mucosa and cervix.  No abnormal discharge noted.  Pap smear obtained.  Normal uterine size, no other palpable masses, no uterine or adnexal tenderness.    Assessment and Plan:    1. Women's annual routine gynecological examination Will follow up results of pap smear and manage accordingly. Mammogram not indicated Labs: lipid profile Refill: none Referral: none Routine  preventative health maintenance measures emphasized. Please refer to After Visit Summary for other counseling recommendations.      Philip Aspen, CNM

## 2019-09-11 LAB — LIPID PANEL
Chol/HDL Ratio: 2.8 ratio (ref 0.0–4.4)
Cholesterol, Total: 216 mg/dL — ABNORMAL HIGH (ref 100–199)
HDL: 76 mg/dL (ref >39)
LDL Chol Calc (NIH): 115 mg/dL — ABNORMAL HIGH (ref 0–99)
Triglycerides: 148 mg/dL (ref 0–149)
VLDL Cholesterol Cal: 25 mg/dL (ref 5–40)

## 2019-09-14 LAB — CYTOLOGY - PAP: Diagnosis: NEGATIVE

## 2019-09-26 ENCOUNTER — Other Ambulatory Visit: Payer: Self-pay | Admitting: Certified Nurse Midwife

## 2019-09-26 MED ORDER — METRONIDAZOLE 500 MG PO TABS: 500.0000 mg | ORAL_TABLET | Freq: Two times a day (BID) | ORAL | 0 refills | Status: AC

## 2019-09-26 NOTE — Telephone Encounter (Signed)
Please sent to [facility]. Thanks, JML

## 2019-09-28 ENCOUNTER — Other Ambulatory Visit: Payer: Self-pay | Admitting: Certified Nurse Midwife

## 2019-09-28 MED ORDER — METRONIDAZOLE 500 MG PO TABS: 500.0000 mg | ORAL_TABLET | Freq: Two times a day (BID) | ORAL | 0 refills | Status: AC

## 2020-03-01 ENCOUNTER — Telehealth: Payer: BC Managed Care – PPO | Admitting: Emergency Medicine

## 2020-03-01 DIAGNOSIS — J4521 Mild intermittent asthma with (acute) exacerbation: Secondary | ICD-10-CM | POA: Diagnosis not present

## 2020-03-01 MED ORDER — ALBUTEROL SULFATE HFA 108 (90 BASE) MCG/ACT IN AERS
2.0000 | INHALATION_SPRAY | Freq: Four times a day (QID) | RESPIRATORY_TRACT | 2 refills | Status: DC | PRN
Start: 2020-03-01 — End: 2022-06-10

## 2020-03-01 MED ORDER — BENZONATATE 200 MG PO CAPS
200.0000 mg | ORAL_CAPSULE | Freq: Two times a day (BID) | ORAL | 0 refills | Status: DC | PRN
Start: 2020-03-01 — End: 2020-09-10

## 2020-03-01 MED ORDER — PREDNISONE 20 MG PO TABS
40.0000 mg | ORAL_TABLET | Freq: Every day | ORAL | 0 refills | Status: AC
Start: 2020-03-01 — End: 2020-03-08

## 2020-03-01 NOTE — Progress Notes (Signed)
Time spent: 10 min  Robin Blankenship,   We are sorry you are not feeling well!  Based on what you have shared with me, it looks like you may have a flare up of your asthma.  Asthma is a chronic (ongoing) lung disease which results in airway obstruction, inflammation and hyper-responsiveness.   Asthma symptoms vary from person to person, with common symptoms including nighttime awakening and decreased ability to participate in normal activities as a result of shortness of breath. It is often triggered by changes in weather, changes in the season, changes in air temperature, or inside (home, school, daycare or work) allergens such as animal dander, mold, mildew, woodstoves or cockroaches.   It can also be triggered by hormonal changes, extreme emotion, physical exertion or an upper respiratory tract illness.     It is important to identify the trigger, and then eliminate or avoid the trigger if possible.   If you have been prescribed medications to be taken on a regular basis, it is important to follow the asthma action plan and to follow guidelines to adjust medication in response to increasing symptoms of decreased peak expiratory flow rate  Treatment: I have prescribed: Albuterol (Proventil HFA; Ventolin HFA) 108 (90 Base) MCG/ACT Inhaler 2 puffs into the lungs every six hours as needed for wheezing or shortness of breath and Prednisone 40mg  by mouth per day for 5 - 7 days.  Additionally, I have prescribed a cough medicine called tessalon perles for any associated cough.  HOME CARE . Only take medications as instructed by your medical team. . Consider wearing a mask or scarf to improve breathing air temperature have been shown to decrease irritation and decrease exacerbations . Get rest. . Taking a steamy shower or using a humidifier may help nasal congestion sand ease sore throat pain. You can place a towel over your head and breathe in the steam from hot water coming from a faucet. . Using  a saline nasal spray works much the same way.  . Cough drops, hare candies and sore throat lozenges may ease your cough.  . Avoid close contacts especially the very you and the elderly . Cover your mouth if you cough or sneeze . Always remember to wash your hands.    GET HELP RIGHT AWAY IF: . You develop worsening symptoms; breathlessness at rest, drowsy, confused or agitated, unable to speak in full sentences . You have coughing fits . You develop a severe headache or visual changes . You develop shortness of breath, difficulty breathing or start having chest pain . Your symptoms persist after you have completed your treatment plan . If your symptoms do not improve within 10 days  MAKE SURE YOU . Understand these instructions. . Will watch your condition. . Will get help right away if you are not doing well or get worse.   Your e-visit answers were reviewed by a board certified advanced clinical practitioner to complete your personal care plan, Depending upon the condition, your plan could have included both over the counter or prescription medications.  Please review your pharmacy choice. Your safety is important to [place]. If you have drug allergies check your prescription carefully. You can use MyChart to ask questions about today's visit, request a non-urgent call back, or ask for a work or school excuse for 24 hours related to this e-Visit. If it has been greater than 24 hours you will need to follow up with your provider, or enter a new e-Visit to address  those concerns.  You will get an e-mail in the next two days asking about your experience. I hope that your e-visit has been valuable and will speed your recovery. Thank you for using e-visits.  I hope you feel better,  Carmon Sails, PA-C [place]

## 2020-03-11 ENCOUNTER — Ambulatory Visit: Payer: Self-pay

## 2020-03-11 ENCOUNTER — Other Ambulatory Visit: Payer: Self-pay
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
## 2020-03-24 ENCOUNTER — Ambulatory Visit: Payer: Self-pay

## 2020-09-10 ENCOUNTER — Encounter: Payer: Self-pay | Admitting: Certified Nurse Midwife

## 2020-09-10 ENCOUNTER — Ambulatory Visit (INDEPENDENT_AMBULATORY_CARE_PROVIDER_SITE_OTHER): Payer: Managed Care, Other (non HMO) | Admitting: Certified Nurse Midwife

## 2020-09-10 ENCOUNTER — Other Ambulatory Visit: Payer: Self-pay

## 2020-09-10 VITALS — BP 115/78 | HR 83 | Resp 16 | Ht 63.5 in | Wt 148.8 lb

## 2020-09-10 DIAGNOSIS — Z1159 Encounter for screening for other viral diseases: Secondary | ICD-10-CM

## 2020-09-10 NOTE — Patient Instructions (Signed)
Preventive Care 21-29 Years Old, Female Preventive care refers to lifestyle choices and visits with your health care provider that can promote health and wellness. This includes:  A yearly physical exam. This is also called an annual wellness visit.  Regular dental and eye exams.  Immunizations.  Screening for certain conditions.  Healthy lifestyle choices, such as: ? Eating a healthy diet. ? Getting regular exercise. ? Not using drugs or products that contain nicotine and tobacco. ? Limiting alcohol use. What can I expect for my preventive care visit? Physical exam Your health care provider may check your:  Height and weight. These may be used to calculate your BMI (body mass index). BMI is a measurement that tells if you are at a healthy weight.  Heart rate and blood pressure.  Body temperature.  Skin for abnormal spots. Counseling Your health care provider may ask you questions about your:  Past medical problems.  Family's medical history.  Alcohol, tobacco, and drug use.  Emotional well-being.  Home life and relationship well-being.  Sexual activity.  Diet, exercise, and sleep habits.  Work and work environment.  Access to firearms.  Method of birth control.  Menstrual cycle.  Pregnancy history. What immunizations do I need? Vaccines are usually given at various ages, according to a schedule. Your health care provider will recommend vaccines for you based on your age, medical history, and lifestyle or other factors, such as travel or where you work.   What tests do I need? Blood tests  Lipid and cholesterol levels. These may be checked every 5 years starting at age 20.  Hepatitis C test.  Hepatitis B test. Screening  Diabetes screening. This is done by checking your blood sugar (glucose) after you have not eaten for a while (fasting).  STD (sexually transmitted disease) testing, if you are at risk.  BRCA-related cancer screening. This may be  done if you have a family history of breast, ovarian, tubal, or peritoneal cancers.  Pelvic exam and Pap test. This may be done every 3 years starting at age 21. Starting at age 30, this may be done every 5 years if you have a Pap test in combination with an HPV test. Talk with your health care provider about your test results, treatment options, and if necessary, the need for more tests.   Follow these instructions at home: Eating and drinking  Eat a healthy diet that includes fresh fruits and vegetables, whole grains, lean protein, and low-fat dairy products.  Take vitamin and mineral supplements as recommended by your health care provider.  Do not drink alcohol if: ? Your health care provider tells you not to drink. ? You are pregnant, may be pregnant, or are planning to become pregnant.  If you drink alcohol: ? Limit how much you have to 0-1 drink a day. ? Be aware of how much alcohol is in your drink. In the [place] one drink equals one 12 oz bottle of beer (355 mL), one 5 oz glass of wine (148 mL), or one 1 oz glass of hard liquor (44 mL).   Lifestyle  Take daily care of your teeth and gums. Brush your teeth every morning and night with fluoride toothpaste. Floss one time each day.  Stay active. Exercise for at least 30 minutes 5 or more days each week.  Do not use any products that contain nicotine or tobacco, such as cigarettes, e-cigarettes, and chewing tobacco. If you need help quitting, ask your health care provider.  Do not   use drugs.  If you are sexually active, practice safe sex. Use a condom or other form of protection to prevent STIs (sexually transmitted infections).  If you do not wish to become pregnant, use a form of birth control. If you plan to become pregnant, see your health care provider for a prepregnancy visit.  Find healthy ways to cope with stress, such as: ? Meditation, yoga, or listening to music. ? Journaling. ? Talking to a trusted  person. ? Spending time with friends and family. Safety  Always wear your seat belt while driving or riding in a vehicle.  Do not drive: ? If you have been drinking alcohol. Do not ride with someone who has been drinking. ? When you are tired or distracted. ? While texting.  Wear a helmet and other protective equipment during sports activities.  If you have firearms in your house, make sure you follow all gun safety procedures.  Seek help if you have been physically or sexually abused. What's next?  Go to your health care provider once a year for an annual wellness visit.  Ask your health care provider how often you should have your eyes and teeth checked.  Stay up to date on all vaccines. This information is not intended to replace advice given to you by your health care provider. Make sure you discuss any questions you have with your health care provider. Document Revised: 12/16/2019 Document Reviewed: 12/29/2017 Elsevier Patient Education  2021 Elsevier Inc.  

## 2020-09-10 NOTE — Progress Notes (Signed)
GYNECOLOGY ANNUAL PREVENTATIVE CARE ENCOUNTER NOTE  History:     Robin Blankenship is a 30 y.o. G0P0000 female here for a routine annual gynecologic exam.  Current complaints: none.   Denies abnormal vaginal bleeding, discharge, pelvic pain, problems with intercourse or other gynecologic concerns.     Social Relationship: married Living:with spouse  Work: Producer, television/film/video  Exercise: gym 5 times a week Smoke/Alcohol/drug use: 2 glasses of wine nightly , denies smoking and drug use.   Gynecologic History Patient's last menstrual period was 09/04/2020 (exact date). Contraception: none Last Pap: 09/10/2019. Results were: normal  Last mammogram: n/a   Upstream - 09/10/20 7035      Pregnancy Intention Screening   Does the patient want to become pregnant in the next year? Ok Either Way    Does the patient's partner want to become pregnant in the next year? Ok Either Way    Would the patient like to discuss contraceptive options today? No          The pregnancy intention screening data noted above was reviewed. Potential methods of contraception were discussed. The patient elected to proceed with No Method - No Contraceptive Precautions.    Obstetric History OB History  Gravida Para Term Preterm AB Living  0 0 0 0 0 0  SAB IAB Ectopic Multiple Live Births  0 0 0 0 0    Past Medical History:  Diagnosis Date  . Asthma     History reviewed. No pertinent surgical history.  Current Outpatient Medications on File Prior to Visit  Medication Sig Dispense Refill  . albuterol (VENTOLIN HFA) 108 (90 Base) MCG/ACT inhaler Inhale into the lungs every 6 (six) hours as needed for wheezing or shortness of breath.    Marland Kitchen albuterol (VENTOLIN HFA) 108 (90 Base) MCG/ACT inhaler Inhale 2 puffs into the lungs every 6 (six) hours as needed for wheezing or shortness of breath. 8 g 2  . loratadine (CLARITIN) 10 MG tablet Take 10 mg by mouth daily.     No current facility-administered  medications on file prior to visit.    No Known Allergies  Social History:  reports that she has quit smoking. Her smoking use included cigarettes. She quit smokeless tobacco use about 8 years ago. She reports current alcohol use. She reports that she does not use drugs.  Family History  Problem Relation Age of Onset  . Heart disease Father   . Diabetes Father   . Stroke Father   . Cancer Paternal Grandmother        breast   . Diabetes Paternal Grandmother   . Stroke Paternal Grandfather     The following portions of the patient's history were reviewed and updated as appropriate: allergies, current medications, past family history, past medical history, past social history, past surgical history and problem list.  Review of Systems Pertinent items noted in HPI and remainder of comprehensive ROS otherwise negative.  Physical Exam:  BP 115/78   Pulse 83   Resp 16   Ht 5' 3.5" (1.613 m)   Wt 148 lb 12.8 oz (67.5 kg)   LMP 09/04/2020 (Exact Date)   BMI 25.95 kg/m  CONSTITUTIONAL: Well-developed, well-nourished female in no acute distress.  HENT:  Normocephalic, atraumatic, External right and left ear normal. Oropharynx is clear and moist EYES: Conjunctivae and EOM are normal. Pupils are equal, round, and reactive to light. No scleral icterus.  NECK: Normal range of motion, supple, no masses.  Normal thyroid.  SKIN: Skin is warm and dry. No rash noted. Not diaphoretic. No erythema. No pallor. MUSCULOSKELETAL: Normal range of motion. No tenderness.  No cyanosis, clubbing, or edema.  2+ distal pulses. NEUROLOGIC: Alert and oriented to person, place, and time. Normal reflexes, muscle tone coordination.  PSYCHIATRIC: Normal mood and affect. Normal behavior. Normal judgment and thought content. CARDIOVASCULAR: Normal heart rate noted, regular rhythm RESPIRATORY: Clear to auscultation bilaterally. Effort and breath sounds normal, no problems with respiration noted. BREASTS: Symmetric  in size. No masses, tenderness, skin changes, nipple drainage, or lymphadenopathy bilaterally.  ABDOMEN: Soft, no distention noted.  No tenderness, rebound or guarding.  PELVIC: Normal appearing external genitalia and urethral meatus; normal appearing vaginal mucosa and cervix.  No abnormal discharge noted.  Pap smear not indicated.  Normal uterine size, no other palpable masses, no uterine or adnexal tenderness.  .   Assessment and Plan:   Annual Well Women GYN Exam  Pap: n/a  Mammogram : n/a  Labs:declines Refills: none Referral:none Pt state she is not actively trying to get pregnant but is ok if it happens. Encouraged daily PNV.  Routine preventative health maintenance measures emphasized. Please refer to After Visit Summary for other counseling recommendations.      Philip Aspen, CNM [facility]

## 2020-10-16 ENCOUNTER — Other Ambulatory Visit: Payer: Self-pay

## 2020-10-16 ENCOUNTER — Ambulatory Visit (INDEPENDENT_AMBULATORY_CARE_PROVIDER_SITE_OTHER): Payer: Managed Care, Other (non HMO) | Admitting: Dermatology

## 2020-10-16 DIAGNOSIS — L821 Other seborrheic keratosis: Secondary | ICD-10-CM

## 2020-10-16 DIAGNOSIS — Z1283 Encounter for screening for malignant neoplasm of skin: Secondary | ICD-10-CM

## 2020-10-16 DIAGNOSIS — D225 Melanocytic nevi of trunk: Secondary | ICD-10-CM

## 2020-10-16 DIAGNOSIS — D229 Melanocytic nevi, unspecified: Secondary | ICD-10-CM

## 2020-10-16 DIAGNOSIS — D2262 Melanocytic nevi of left upper limb, including shoulder: Secondary | ICD-10-CM

## 2020-10-16 DIAGNOSIS — L814 Other melanin hyperpigmentation: Secondary | ICD-10-CM

## 2020-10-16 DIAGNOSIS — Q825 Congenital non-neoplastic nevus: Secondary | ICD-10-CM

## 2020-10-16 DIAGNOSIS — B079 Viral wart, unspecified: Secondary | ICD-10-CM

## 2020-10-16 DIAGNOSIS — D1800 Hemangioma unspecified site: Secondary | ICD-10-CM

## 2020-10-16 NOTE — Patient Instructions (Addendum)
If you have any questions or concerns for your doctor, please call our main line at [phone] and press option 4 to reach your doctor's medical assistant. If no one answers, please leave a voicemail as directed and we will return your call as soon as possible. Messages left after 4 pm will be answered the following business day.   You may also send [place] a message via [place]. We typically respond to MyChart messages within 1-2 business days.  For prescription refills, please ask your pharmacy to contact our office. Our fax number is [phone].  If you have an urgent issue when the clinic is closed that cannot wait until the next business day, you can page your doctor at the number below.    Please note that while we do our best to be available for urgent issues outside of office hours, we are not available 24/7.   If you have an urgent issue and are unable to reach [place], you may choose to seek medical care at your doctor's office, retail clinic, urgent care center, or emergency room.  If you have a medical emergency, please immediately call 911 or go to the emergency department.  Pager Numbers  - Dr. Nehemiah Massed: [phone]  - Dr. Laurence Ferrari: [phone]  - Dr. Nicole Kindred: [phone]  In the event of inclement weather, please call our main line at [phone] for an update on the status of any delays or closures.  Dermatology Medication Tips: Please keep the boxes that topical medications come in in order to help keep track of the instructions about where and how to use these. Pharmacies typically print the medication instructions only on the boxes and not directly on the medication tubes.   If your medication is too expensive, please contact our office at [phone] option 4 or send [place] a message through [place].   We are unable to tell what your co-pay for medications will be in advance as this is different depending on your insurance coverage. However, we may be able to find a substitute  medication at lower cost or fill out paperwork to get insurance to cover a needed medication.   If a prior authorization is required to get your medication covered by your insurance company, please allow [place] 1-2 business days to complete this process.  Drug prices often vary depending on where the prescription is filled and some pharmacies may offer cheaper prices.  The website www.goodrx.com contains coupons for medications through different pharmacies. The prices here do not account for what the cost may be with help from insurance (it may be cheaper with your insurance), but the website can give you the price if you did not use any insurance.  - You can print the associated coupon and take it with your prescription to the pharmacy.  - You may also stop by our office during regular business hours and pick up a GoodRx coupon card.  - If you need your prescription sent electronically to a different pharmacy, notify our office through [facility] or by phone at [phone] option 4.  Viral Warts & Molluscum Contagiosum  Viral warts and molluscum contagiosum are growths of the skin caused by viral infection of the skin. If you have been given the diagnosis of viral warts or molluscum contagiosum there are a few things that you must understand about your condition:  There is no guaranteed treatment method available for this condition. Multiple treatments may be required, The treatments may be time consuming and require multiple visits to  the dermatology office. The treatment may be expensive. You will be charged each time you come into the office to have the spots treated. The treated areas may develop new lesions further complicating treatment. The treated areas may leave a scar. There is no guarantee that even after multiple treatments that the spots will be successfully treated. These are caused by a viral infection and can be spread to other areas of the skin and to other people by  direct contact. Therefore, new spots may occur.  Melanoma ABCDEs  Melanoma is the most dangerous type of skin cancer, and is the leading cause of death from skin disease.  You are more likely to develop melanoma if you: Have light-colored skin, light-colored eyes, or red or blond hair Spend a lot of time in the sun Tan regularly, either outdoors or in a tanning bed Have had blistering sunburns, especially during childhood Have a close family member who has had a melanoma Have atypical moles or large birthmarks  Early detection of melanoma is key since treatment is typically straightforward and cure rates are extremely high if we catch it early.   The first sign of melanoma is often a change in a mole or a new dark spot.  The ABCDE system is a way of remembering the signs of melanoma.  A for asymmetry:  The two halves do not match. B for border:  The edges of the growth are irregular. C for color:  A mixture of colors are present instead of an even brown color. D for diameter:  Melanomas are usually (but not always) greater than 57mm - the size of a pencil eraser. E for evolution:  The spot keeps changing in size, shape, and color.  Please check your skin once per month between visits. You can use a small mirror in front and a large mirror behind you to keep an eye on the back side or your body.   If you see any new or changing lesions before your next follow-up, please call to schedule a visit.  Please continue daily skin protection including broad spectrum sunscreen SPF 30+ to sun-exposed areas, reapplying every 2 hours as needed when you're outdoors.   Staying in the shade or wearing long sleeves, sun glasses (UVA+UVB protection) and wide brim hats (4-inch brim around the entire circumference of the hat) are also recommended for sun protection.

## 2020-10-16 NOTE — Progress Notes (Signed)
   Follow-Up Visit   Subjective  Robin Blankenship is a 30 y.o. female who presents for the following: Annual Exam (Patient c/o warts on the hands and would like to discuss treatment options today).  The following portions of the chart were reviewed this encounter and updated as appropriate:       Review of Systems:  No other skin or systemic complaints except as noted in HPI or Assessment and Plan.  Objective  Well appearing patient in no apparent distress; mood and affect are within normal limits.  A full examination was performed including scalp, head, eyes, ears, nose, lips, neck, chest, axillae, abdomen, back, buttocks, bilateral upper extremities, bilateral lower extremities, hands, feet, fingers, toes, fingernails, and toenails. All findings within normal limits unless otherwise noted below.  R index finger, L index finger, R palm (3) Verrucous papules -- Discussed viral etiology and contagion.  R index finger 0.4 cm  L index finger 0.3 cm  R palm 0.3 cm   L post shoulder 2.5 mm medium dark brown macule   L sacrum 2.0 mm medium dark brown macule  R post shoulder 10.0 mm brown speckled papule  Assessment & Plan  Viral warts, unspecified type (3) R index finger, L index finger, R palm  Discussed viral etiology and risk of spread.  Discussed multiple treatments may be required to clear warts.  Discussed possible post-treatment dyspigmentation and risk of recurrence.  Destruction of lesion - R index finger, L index finger, R palm  Destruction method: cryotherapy   Informed consent: discussed and consent obtained   Lesion destroyed using liquid nitrogen: Yes   Region frozen until ice ball extended beyond lesion: Yes   Outcome: patient tolerated procedure well with no complications   Post-procedure details: wound care instructions given   Additional details:  Prior to procedure, discussed risks of blister formation, small wound, skin dyspigmentation, or rare scar following  cryotherapy. Recommend Vaseline ointment to treated areas while healing.   Nevus (2) L post shoulder; L sacrum  Benign-appearing.  Observation.  Call clinic for new or changing lesions.  Recommend daily use of broad spectrum spf 30+ sunscreen to sun-exposed areas.   Congenital non-neoplastic nevus R post shoulder  Benign-appearing.  Observation.  Call clinic for new or changing lesions.  Recommend daily use of broad spectrum spf 30+ sunscreen to sun-exposed areas.    Lentigines - Scattered tan macules - Due to sun exposure - Benign-appering, observe - Recommend daily broad spectrum sunscreen SPF 30+ to sun-exposed areas, reapply every 2 hours as needed. - Call for any changes  Seborrheic Keratoses - Stuck-on, waxy, tan-brown papules and/or plaques  - Benign-appearing - Discussed benign etiology and prognosis. - Observe - Call for any changes  Melanocytic Nevi - Tan-brown and/or pink-flesh-colored symmetric macules and papules - Benign appearing on exam today - Observation - Call clinic for new or changing moles - Recommend daily use of broad spectrum spf 30+ sunscreen to sun-exposed areas.   Hemangiomas - Red papules - Discussed benign nature - Observe - Call for any changes  Skin cancer screening performed today.  Return in about 1 month (around 11/15/2020) for wart follow up .  Luther Redo, CMA, am acting as scribe for Brendolyn Patty, MD .  Documentation: I have reviewed the above documentation for accuracy and completeness, and I agree with the above.  Brendolyn Patty MD

## 2020-10-22 ENCOUNTER — Telehealth: Payer: Self-pay

## 2020-10-22 MED ORDER — MUPIROCIN 2 % EX OINT
1.0000 | TOPICAL_OINTMENT | Freq: Two times a day (BID) | CUTANEOUS | 0 refills | Status: DC
Start: 2020-10-22 — End: 2022-06-10

## 2020-10-22 NOTE — Telephone Encounter (Signed)
Patient called regarding warts that were frozen last week by Dr. Nicole Kindred. She states the areas seem to be getting worse. She states they are red and one spot began to throb last night. Please advise if there is anything additional we can do for her.

## 2020-10-22 NOTE — Telephone Encounter (Signed)
RX sent in and patient advised. 

## 2020-11-24 ENCOUNTER — Ambulatory Visit: Payer: Managed Care, Other (non HMO) | Admitting: Dermatology

## 2021-06-16 ENCOUNTER — Encounter: Payer: Self-pay | Admitting: Podiatry

## 2021-06-16 ENCOUNTER — Other Ambulatory Visit: Payer: Self-pay

## 2021-06-16 ENCOUNTER — Ambulatory Visit (INDEPENDENT_AMBULATORY_CARE_PROVIDER_SITE_OTHER): Payer: Managed Care, Other (non HMO)

## 2021-06-16 ENCOUNTER — Ambulatory Visit (INDEPENDENT_AMBULATORY_CARE_PROVIDER_SITE_OTHER): Payer: Managed Care, Other (non HMO) | Admitting: Podiatry

## 2021-06-16 DIAGNOSIS — M2011 Hallux valgus (acquired), right foot: Secondary | ICD-10-CM | POA: Diagnosis not present

## 2021-06-16 DIAGNOSIS — M2041 Other hammer toe(s) (acquired), right foot: Secondary | ICD-10-CM

## 2021-06-16 DIAGNOSIS — M2012 Hallux valgus (acquired), left foot: Secondary | ICD-10-CM | POA: Diagnosis not present

## 2021-06-22 NOTE — Progress Notes (Signed)
° °  Subjective: 31 y.o. female presents today as a new patient for evaluation of tenderness and pain associated to the right great toe.  Patient states that she has had gradual development of bunion deformity for the last 6 years.  Patient states that it feels achy and almost broken on a daily basis.  Aggravated by any pressure to the foot or even walking.  She has tried wider fitting shoes with no improvement.  She presents for further treatment and evaluation   Past Medical History:  Diagnosis Date   Asthma    No past surgical history on file. No Known Allergies   Objective: Physical Exam General: The patient is alert and oriented x3 in no acute distress.  Dermatology: Skin is cool, dry and supple bilateral lower extremities. Negative for open lesions or macerations.  Vascular: Palpable pedal pulses bilaterally. No edema or erythema noted. Capillary refill within normal limits.  Neurological: Epicritic and protective threshold grossly intact bilaterally.   Musculoskeletal Exam: Clinical evidence of bunion deformity noted to the respective foot. There is moderate pain on palpation range of motion of the first MPJ. Lateral deviation of the hallux noted consistent with hallux abductovalgus.  Radiographic Exam RT foot: Increased intermetatarsal angle greater than 15 with a hallux abductus angle greater than 30 noted on AP view.   Assessment: 1. 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 Patient was evaluated. X-Rays reviewed. 2.  Today we had a long discussion regarding surgical correction of the bunion deformity.  All possible complications and details the procedure were explained.  No guarantees were expressed or implied.  All patient questions were answered.  Surgery and postoperative course were explained in detail as well.  After discussing surgical correction of the bunion deformity as well as additional conservative treatments, the patient would like to go home  and look at her schedule to see if she can afford to take the time off. 3.  In the meantime we will have our billing specialist contact the patient to know the estimated costs 4.  If the patient would like to proceed with surgery within the next few months she can come in to the office to sign the paperwork and surgical consent forms.  Surgery would consist of bunionectomy with osteotomy right. 5.  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 Edrick Kins, DPM    [address], [place] [address]                Office [phone]  Fax [phone]

## 2021-07-03 ENCOUNTER — Ambulatory Visit (INDEPENDENT_AMBULATORY_CARE_PROVIDER_SITE_OTHER): Payer: Managed Care, Other (non HMO) | Admitting: Podiatry

## 2021-07-03 ENCOUNTER — Other Ambulatory Visit: Payer: Self-pay

## 2021-07-03 DIAGNOSIS — M2012 Hallux valgus (acquired), left foot: Secondary | ICD-10-CM

## 2021-07-03 DIAGNOSIS — M2011 Hallux valgus (acquired), right foot: Secondary | ICD-10-CM

## 2021-07-03 NOTE — Progress Notes (Signed)
? ?  Subjective: 31 y.o. female presents today for surgical consult of the bunion deformity to the right foot.  Patient was last seen in the office on 06/16/2021.  At that time we discussed bunion surgery.  She is ready to proceed with bunion surgery and for surgical consult today. ? ?Past Medical History:  ?Diagnosis Date  ? Asthma   ?No past surgical history on file. ?No Known Allergies ? ?Objective: ?Physical Exam ?General: The patient is alert and oriented x3 in no acute distress. ? ?Dermatology: Skin is cool, dry and supple bilateral lower extremities. Negative for open lesions or macerations. ? ?Vascular: Palpable pedal pulses bilaterally. No edema or erythema noted. Capillary refill within normal limits. ? ?Neurological: Epicritic and protective threshold grossly intact bilaterally.  ? ?Musculoskeletal Exam: Clinical evidence of bunion deformity noted to the respective foot. There is moderate pain on palpation range of motion of the first MPJ. Lateral deviation of the hallux noted consistent with hallux abductovalgus. ? ?Radiographic Exam RT foot: Increased intermetatarsal angle greater than 15? with a hallux abductus angle greater than 30? noted on AP view.  ? ?Assessment: ?1. 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 ?Plan of Care:  ?1. Patient was evaluated. X-Rays reviewed. ?2.  Additional questions were answered for the patient.  Again, no guarantees were expressed or implied.  All possible complications and details of the procedure were explained. ?3.  Authorization for surgery initiated today. Surgery would consist of bunionectomy with osteotomy right. ?4.  Return to clinic one week postop ? 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 ? ?Edrick Kins, DPM ?[facility] ? ?Dr. Edrick Kins, DPM  ?  ?[address]                                       ?[place], [place] [address]                ?Office [phone]  ?Fax [phone] ? ? ? ? ? ?

## 2021-07-06 ENCOUNTER — Telehealth: Payer: Self-pay

## 2021-07-06 NOTE — Telephone Encounter (Signed)
Received surgery paperwork from the [place] office. Left a message for Kirstan to call and schedule surgery with Dr. Amalia Hailey ?

## 2021-07-06 NOTE — Telephone Encounter (Signed)
DOS 08/06/2021 ? ?[place] BUNIONECOTOMY RT - [id number] ? ?[employer] EFFECTIVE DATE - 04/02/2020 ? ?PLAN DEDUCTIBLE - $2000.00 W/ $286.94 MET ?OUT OF POCKET - $4000 W/$286.94 MET ?COPAY $0.00 ?COINSURANCE - 80% ? ?PER AUTOMATED SYSTEM, NO PER CERT REQUIRED FOR CPT [id number]. CONF # [id number] ? ?

## 2021-08-06 ENCOUNTER — Other Ambulatory Visit: Payer: Self-pay | Admitting: Podiatry

## 2021-08-06 ENCOUNTER — Encounter: Payer: Self-pay | Admitting: Podiatry

## 2021-08-06 DIAGNOSIS — M2011 Hallux valgus (acquired), right foot: Secondary | ICD-10-CM | POA: Diagnosis not present

## 2021-08-06 DIAGNOSIS — M79676 Pain in unspecified toe(s): Secondary | ICD-10-CM

## 2021-08-06 MED ORDER — DICLOFENAC SODIUM 75 MG PO TBEC: 75.0000 mg | DELAYED_RELEASE_TABLET | Freq: Two times a day (BID) | ORAL | 1 refills | Status: DC

## 2021-08-06 MED ORDER — OXYCODONE-ACETAMINOPHEN 5-325 MG PO TABS: 1.0000 | ORAL_TABLET | ORAL | 0 refills | Status: DC | PRN

## 2021-08-06 NOTE — Progress Notes (Signed)
PRN postop 

## 2021-08-13 ENCOUNTER — Ambulatory Visit (INDEPENDENT_AMBULATORY_CARE_PROVIDER_SITE_OTHER): Payer: Managed Care, Other (non HMO) | Admitting: Podiatry

## 2021-08-13 ENCOUNTER — Ambulatory Visit (INDEPENDENT_AMBULATORY_CARE_PROVIDER_SITE_OTHER): Payer: Managed Care, Other (non HMO)

## 2021-08-13 ENCOUNTER — Encounter: Payer: Self-pay | Admitting: Podiatry

## 2021-08-13 VITALS — BP 127/74 | HR 81 | Temp 98.1°F

## 2021-08-13 DIAGNOSIS — M2012 Hallux valgus (acquired), left foot: Secondary | ICD-10-CM | POA: Diagnosis not present

## 2021-08-13 DIAGNOSIS — M2011 Hallux valgus (acquired), right foot: Secondary | ICD-10-CM

## 2021-08-13 DIAGNOSIS — Z9889 Other specified postprocedural states: Secondary | ICD-10-CM | POA: Diagnosis not present

## 2021-08-13 NOTE — Progress Notes (Signed)
?  Subjective:  ?Patient ID: Robin Blankenship, female    DOB: [birth year],  MRN: [id number] ? ?Chief Complaint  ?Patient presents with  ? Routine Post Op  ?  POV #1 DOS 08/06/2021 BUNIONECTOMY W/OSTEOTOMY RT/DR EVANS PT  ? ? ?DOS: 08/06/2021 ?Procedure: Bunionectomy right foot ? ?31 y.o. female returns for post-op check.  Patient states she is doing well.  Patient had the surgery done by Dr. Amalia Hailey.  Minimal pain.  Pain is controlled with pain medication.  Bandages clean dry and intact nonweightbearing to the right lower extremity ? ?Review of Systems: Negative except as noted in the HPI. Denies N/V/F/Ch. ? ?Past Medical History:  ?Diagnosis Date  ? Asthma   ? ? ?Current Outpatient Medications:  ?  albuterol (VENTOLIN HFA) 108 (90 Base) MCG/ACT inhaler, Inhale into the lungs every 6 (six) hours as needed for wheezing or shortness of breath. (Patient not taking: Reported on 10/16/2020), Disp: , Rfl:  ?  albuterol (VENTOLIN HFA) 108 (90 Base) MCG/ACT inhaler, Inhale 2 puffs into the lungs every 6 (six) hours as needed for wheezing or shortness of breath. (Patient not taking: Reported on 10/16/2020), Disp: 8 g, Rfl: 2 ?  Cholecalciferol (VITAMIN D3 PO), Take by mouth., Disp: , Rfl:  ?  diclofenac (VOLTAREN) 75 MG EC tablet, Take 1 tablet (75 mg total) by mouth 2 (two) times daily., Disp: 60 tablet, Rfl: 1 ?  loratadine (CLARITIN) 10 MG tablet, Take 10 mg by mouth daily., Disp: , Rfl:  ?  Multiple Vitamin (MULTIVITAMIN ADULT PO), Take by mouth., Disp: , Rfl:  ?  mupirocin ointment (BACTROBAN) 2 %, Apply 1 application topically 2 (two) times daily., Disp: 22 g, Rfl: 0 ?  oxyCODONE-acetaminophen (PERCOCET) 5-325 MG tablet, Take 1 tablet by mouth every 4 (four) hours as needed for severe pain., Disp: 30 tablet, Rfl: 0 ? ?Social History  ? ?Tobacco Use  ?Smoking Status Former  ? Types: Cigarettes  ?Smokeless Tobacco Former  ? Quit date: 09/18/2011  ? ? ?No Known Allergies ?Objective:  ? ?Vitals:  ? 08/13/21 0917  ?BP: 127/74  ?Pulse: 81   ?Temp: 98.1 ?F (36.7 ?C)  ? ?There is no height or weight on file to calculate BMI. ?Constitutional Well developed. ?Well nourished.  ?Vascular Foot warm and well perfused. ?Capillary refill normal to all digits.   ?Neurologic Normal speech. ?Oriented to person, place, and time. ?Epicritic sensation to light touch grossly present bilaterally.  ?Dermatologic Skin healing well without signs of infection. Skin edges well coapted without signs of infection.  ?Orthopedic: Tenderness to palpation noted about the surgical site.  ? ?Radiographs: 3 views of skeletally mature the right foot: Good correction alignment noted.  Reduction of the bunion deformity noted.  Sacks of [place] position is within normal limits.  No hardware failure noted ?Assessment:  ? ?1. Acquired hallux valgus of both feet   ?2. S/P foot surgery, right   ? ?Plan:  ?Patient was evaluated and treated and all questions answered. ? ?S/p foot surgery right ?-Progressing as expected post-operatively. ?-XR: See above ?-WB Status: Nonweightbearing to the right lower extremity in crutches/knee scooter ?-Sutures: Intact.  No clinical signs of Deis is noted.  No complication noted. ?-Medications: None ?-Foot redressed. ? ?No follow-ups on file.  ?

## 2021-08-14 ENCOUNTER — Encounter: Payer: Managed Care, Other (non HMO) | Admitting: Podiatry

## 2021-08-17 ENCOUNTER — Telehealth: Payer: Self-pay | Admitting: *Deleted

## 2021-08-17 NOTE — Telephone Encounter (Signed)
"  I just came for a post-op yesterday for a bunion surgery that I had.  I was just looking at my foot and noticed that some blood is coming through.  I think it might be the top stitch.  I want to know what I need to do about that.  I know it's Friday." ?

## 2021-08-17 NOTE — Telephone Encounter (Signed)
Leave clean and dry until next f/u appt on 4/21. Thanks, Dr. Amalia Hailey

## 2021-08-18 NOTE — Telephone Encounter (Signed)
I called and left a message of Dr. Amarilys Chesterfield response. ? ?"Leave it clean and dry until next f/u appt on 4/21". ?

## 2021-08-21 ENCOUNTER — Telehealth: Payer: Self-pay | Admitting: *Deleted

## 2021-08-21 ENCOUNTER — Encounter: Payer: Self-pay | Admitting: *Deleted

## 2021-08-21 ENCOUNTER — Ambulatory Visit (INDEPENDENT_AMBULATORY_CARE_PROVIDER_SITE_OTHER): Payer: Managed Care, Other (non HMO) | Admitting: Podiatry

## 2021-08-21 DIAGNOSIS — Z9889 Other specified postprocedural states: Secondary | ICD-10-CM

## 2021-08-21 NOTE — Telephone Encounter (Signed)
"  I was there this morning.  I forgot to ask Dr. Amalia Hailey to write me a note saying what my restrictions are for work."  I will send him a message.  Once he responds, I will write your letter, and call you when it's ready for pick up.  "Great, thanks!" ?

## 2021-08-21 NOTE — Progress Notes (Signed)
? ?  Subjective:  ?Patient presents today status post bunionectomy with first metatarsal osteotomy RT. DOS: 08/06/2021.  Patient states that she is doing well.  She has been walking on her foot.  She has also noticed some increased swelling to the area but overall doing well. ? ?Past Medical History:  ?Diagnosis Date  ? Asthma   ? ? No past surgical history on file. ?No Known Allergies ? ? ?Objective/Physical Exam ?Neurovascular status intact.  Skin incisions appear to be well coapted with sutures intact. No sign of infectious process noted. No dehiscence. No active bleeding noted. Moderate edema noted to the surgical extremity. ? ?Assessment: ?1. s/p bunionectomy with first metatarsal osteotomy RT foot. DOS: 08/06/2021 ? ? ?Plan of Care:  ?1. Patient was evaluated. X-rays reviewed that were taken last visit ?2.  Sutures removed ?3.  Continue Ace wrap daily.  Ace wrap provided ?4.  Continue minimal weightbearing in the cam boot ?5.  Return to clinic 2 weeks for follow-up x-ray ? ? ? ?Edrick Kins, DPM ?[place] ? ?Dr. Edrick Kins, DPM  ?  ?[address]                                    ?[place], [place] [address]                ?Office [phone]  ?Fax [phone] ? ? ? ? ? ?

## 2021-08-21 NOTE — Telephone Encounter (Signed)
Yep that's fine. Whatever she needs the note to state. - Dr. Amalia Hailey

## 2021-08-21 NOTE — Telephone Encounter (Signed)
I called and informed Robin Blankenship that Dr. Amalia Hailey said she must only do sedentary work, no field work and elevate as much as possible.  I told her I was doing her letter and she could retrieve it in [place] or come by on Monday to pick it up.  She said she would stop by on Monday to pick it up and to pay her payment balance. ?

## 2021-08-24 ENCOUNTER — Telehealth: Payer: Self-pay | Admitting: Podiatry

## 2021-08-24 NOTE — Telephone Encounter (Signed)
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 She states that there aren't any stairs involved, and they are able to accommodate her knee scooter.  ? ?Please advise.  ?

## 2021-08-24 NOTE — Telephone Encounter (Signed)
Yes that is fine. - Dr. Amalia Hailey

## 2021-09-04 ENCOUNTER — Ambulatory Visit (INDEPENDENT_AMBULATORY_CARE_PROVIDER_SITE_OTHER): Payer: Managed Care, Other (non HMO) | Admitting: Podiatry

## 2021-09-04 ENCOUNTER — Encounter: Payer: Self-pay | Admitting: Podiatry

## 2021-09-04 ENCOUNTER — Ambulatory Visit (INDEPENDENT_AMBULATORY_CARE_PROVIDER_SITE_OTHER): Payer: Managed Care, Other (non HMO)

## 2021-09-04 DIAGNOSIS — Z9889 Other specified postprocedural states: Secondary | ICD-10-CM

## 2021-09-04 NOTE — Progress Notes (Signed)
? ?  Subjective:  ?Patient presents today status post bunionectomy with first metatarsal osteotomy RT. DOS: 08/06/2021.  Patient doing well.  Minimal to no pain.  She only has some intermittent tenderness occasionally.  She has been weightbearing in the cam boot and also using the knee scooter. ? ?Past Medical History:  ?Diagnosis Date  ? Asthma   ? ? No past surgical history on file. ?No Known Allergies ? ? ?Objective/Physical Exam ?Neurovascular status intact.  Skin incisions appear to be well coapted however due to some of the swelling it does appear that the incision site has slightly widened.  No true dehiscence but there is some superficial areas that are still healing.  No clinical evidence of infection.  There continues to be some moderate edema to the foot ? ?Radiographic exam RT foot 09/04/2021 ?Good alignment of the first ray.  Orthopedic screws intact.  Good reduction of the IM angle. ? ?Assessment: ?1. s/p bunionectomy with first metatarsal osteotomy RT foot. DOS: 08/06/2021 ? ? ?Plan of Care:  ?1. Patient was evaluated. X-rays reviewed  ?2.  Patient may begin to slowly transition out of the cam boot into good supportive shoes and sneakers over the next 3-4 weeks ?3.  Compression ankle sleeve dispensed.  Wear daily ?4.  Continue Betadine ointment with a light Band-Aid over the incision site ?5.  Return to clinic 4 weeks ? ?*Septic inspector for [facility] ? ?Edrick Kins, DPM ?[place] ? ?Dr. Edrick Kins, DPM  ?  ?[address]                                    ?[place], [place] [address]                ?Office [phone]  ?Fax [phone] ? ? ? ? ? ?

## 2021-09-14 ENCOUNTER — Encounter: Payer: 59 | Admitting: Certified Nurse Midwife

## 2021-09-15 ENCOUNTER — Telehealth: Payer: Self-pay | Admitting: Podiatry

## 2021-09-15 NOTE — Telephone Encounter (Signed)
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 She wants to know if she could use her boot instead so that she can perform some of her outside job functions. Please advise. ?

## 2021-09-16 NOTE — Telephone Encounter (Signed)
That is fine.  Please notify patient.  Thanks, Dr. Amalia Hailey

## 2021-09-17 NOTE — Telephone Encounter (Signed)
Called patient, no answer, left vmessage per physician that it is fine to use her boot instead.

## 2021-10-02 ENCOUNTER — Ambulatory Visit (INDEPENDENT_AMBULATORY_CARE_PROVIDER_SITE_OTHER): Payer: Managed Care, Other (non HMO) | Admitting: Podiatry

## 2021-10-02 ENCOUNTER — Encounter: Payer: Self-pay | Admitting: Podiatry

## 2021-10-02 DIAGNOSIS — Z9889 Other specified postprocedural states: Secondary | ICD-10-CM

## 2021-10-02 NOTE — Progress Notes (Signed)
   Subjective:  Patient presents today status post bunionectomy with first metatarsal osteotomy RT. DOS: 08/06/2021.  Patient doing well.  Minimal to no pain.  She only has some intermittent tenderness occasionally.  She has been weightbearing in the cam boot and also using the knee scooter.  Past Medical History:  Diagnosis Date   Asthma     No past surgical history on file. No Known Allergies    Objective/Physical Exam Neurovascular status intact.  There is some mild to moderate edema noted throughout the surgical area.  Open wound noted along the central portion of the incision site.  Please see above noted photo.  Currently there is no drainage.  No erythema around the area.  No malodor.  Clinically this looks like it should heal uneventfully  Radiographic exam RT foot 09/04/2021 Good alignment of the first ray.  Orthopedic screws intact.  Good reduction of the IM angle.  Assessment: 1. s/p bunionectomy with first metatarsal osteotomy RT foot. DOS: 08/06/2021 2.  Small area of dehiscence central portion of the incision  Plan of Care:  1. Patient was evaluated.  2.  The patient is now about 8 weeks postop.  She may now completely discontinue the cam boot.  She states that over the past 2 weeks she has been transitioning out of the cam boot without any problems or increased pain 3.  Continue compression ankle sleeve 4.  Silvadene cream was provided for the patient to apply daily with a Band-Aid 5.  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 Edrick Kins, DPM    [address], [place] [address]                Office [phone]  Fax [phone]

## 2021-10-23 ENCOUNTER — Ambulatory Visit (INDEPENDENT_AMBULATORY_CARE_PROVIDER_SITE_OTHER): Payer: Managed Care, Other (non HMO) | Admitting: Podiatry

## 2021-10-23 ENCOUNTER — Ambulatory Visit (INDEPENDENT_AMBULATORY_CARE_PROVIDER_SITE_OTHER): Payer: Managed Care, Other (non HMO)

## 2021-10-23 DIAGNOSIS — Z9889 Other specified postprocedural states: Secondary | ICD-10-CM

## 2021-11-12 ENCOUNTER — Telehealth: Payer: Self-pay | Admitting: Podiatry

## 2021-11-12 ENCOUNTER — Encounter: Payer: Self-pay | Admitting: Podiatry

## 2021-11-12 NOTE — Telephone Encounter (Signed)
Okay to give. - Dr. Amalia Hailey

## 2021-11-12 NOTE — Telephone Encounter (Signed)
Patient came in and wants a note for work saying she is cleared and can resume to full duty with no restrictions for work.

## 2021-11-12 NOTE — Telephone Encounter (Signed)
Wrote letter for pt called and let her know her note was ready.

## 2021-11-24 ENCOUNTER — Encounter: Payer: Managed Care, Other (non HMO) | Admitting: Certified Nurse Midwife

## 2021-12-22 ENCOUNTER — Ambulatory Visit: Payer: Managed Care, Other (non HMO) | Admitting: Podiatry

## 2022-02-05 ENCOUNTER — Ambulatory Visit: Payer: Managed Care, Other (non HMO) | Admitting: Podiatry

## 2022-02-05 ENCOUNTER — Ambulatory Visit: Payer: Managed Care, Other (non HMO)

## 2022-02-05 DIAGNOSIS — M2012 Hallux valgus (acquired), left foot: Secondary | ICD-10-CM

## 2022-02-05 NOTE — Progress Notes (Signed)
   Chief Complaint  Patient presents with   surgery consult    Patient is her for surgical consult for bunion on left foot great toe.    Subjective: 31 y.o. female presents today for evaluation of left great toe pain and bunion deformity.  Patient recently had correction of the right foot bunion with good healing.  Patient states that she is ready now to proceed with left foot bunion surgery.  Conservative treatments including shoe gear modifications have failed.  She has pain and tenderness on a constant basis in her shoes and work boots.  Presenting for further treatment and surgical consultation  Past Medical History:  Diagnosis Date   Asthma     No past surgical history on file.  No Known Allergies   Objective: Physical Exam General: The patient is alert and oriented x3 in no acute distress.  Dermatology: Skin is cool, dry and supple bilateral lower extremities. Negative for open lesions or macerations.  Vascular: Palpable pedal pulses bilaterally. No edema or erythema noted. Capillary refill within normal limits.  Neurological: Epicritic and protective threshold grossly intact bilaterally.   Musculoskeletal Exam: Clinical evidence of bunion deformity noted to the respective foot. There is moderate pain on palpation range of motion of the first MPJ. Lateral deviation of the hallux noted consistent with hallux abductovalgus.  Radiographic Exam LT foot: Normal osseous mineralization.  No acute fractures identified.  Increased intermetatarsal angle greater than 15 with a hallux abductus angle greater than 30 noted on AP view.   Assessment: 1.  Hallux valgus left 2.  History of bunionectomy RT.  08/06/2021   Plan of Care:  1. Patient was evaluated. X-Rays reviewed. 2.  Today we discussed bunion surgery again.  This is the same procedure that we had performed in April.  Risk benefits advantages and disadvantages were explained again.  No guarantees were expressed or implied.   The patient consented for surgical correction of her symptomatic bunion 3.  Authorization for surgery was initiated today.  Surgery will consist of bunionectomy with first metatarsal osteotomy left 4.  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 Edrick Kins, DPM    [address], [place] [address]                Office [phone]  Fax [phone]

## 2022-02-16 ENCOUNTER — Telehealth: Payer: Self-pay | Admitting: Podiatry

## 2022-02-16 NOTE — Telephone Encounter (Addendum)
DOS: 03/18/2022  [employer]  Procedure: [place] Bunionectomy Lt DX: M20.12  Family Deductible: $2,000 with $0 remaining Family OOP: $4,000 with $2,820.80 met CoInsurance: 20%  No Prior Authorization is Required per [facility] automated system.  Call Reference #: [id number]

## 2022-02-23 DIAGNOSIS — M79676 Pain in unspecified toe(s): Secondary | ICD-10-CM

## 2022-03-18 ENCOUNTER — Other Ambulatory Visit: Payer: Self-pay | Admitting: Podiatry

## 2022-03-18 DIAGNOSIS — M2012 Hallux valgus (acquired), left foot: Secondary | ICD-10-CM | POA: Diagnosis not present

## 2022-03-18 MED ORDER — DICLOFENAC SODIUM 75 MG PO TBEC: 75.0000 mg | DELAYED_RELEASE_TABLET | Freq: Two times a day (BID) | ORAL | 1 refills | Status: DC

## 2022-03-18 MED ORDER — OXYCODONE-ACETAMINOPHEN 5-325 MG PO TABS: 1.0000 | ORAL_TABLET | ORAL | 0 refills | Status: DC | PRN

## 2022-03-18 NOTE — Progress Notes (Signed)
PRN postop 

## 2022-03-24 ENCOUNTER — Ambulatory Visit (INDEPENDENT_AMBULATORY_CARE_PROVIDER_SITE_OTHER): Payer: Managed Care, Other (non HMO)

## 2022-03-24 ENCOUNTER — Ambulatory Visit (INDEPENDENT_AMBULATORY_CARE_PROVIDER_SITE_OTHER): Payer: Managed Care, Other (non HMO) | Admitting: Podiatry

## 2022-03-24 DIAGNOSIS — M2012 Hallux valgus (acquired), left foot: Secondary | ICD-10-CM

## 2022-03-24 DIAGNOSIS — M2011 Hallux valgus (acquired), right foot: Secondary | ICD-10-CM | POA: Diagnosis not present

## 2022-03-24 NOTE — Progress Notes (Signed)
  Subjective:  Patient ID: Robin Blankenship, female    DOB: July 23, 1990,  MRN: [id number]  Chief Complaint  Patient presents with   Routine Post Op    POV #1 DOS 03/18/2022 BUNINOECTOMY W/OSTEOTOMY LT/DR EVANS PT     31 y.o. female returns for post-op check.  She is doing well she is not having much pain at all  Review of Systems: Negative except as noted in the HPI. Denies N/V/F/Ch.   Objective:  There were no vitals filed for this visit. There is no height or weight on file to calculate BMI. Constitutional Well developed. Well nourished.  Vascular Foot warm and well perfused. Capillary refill normal to all digits.  Calf is soft and supple, no posterior calf or knee pain, negative Homans' sign  Neurologic Normal speech. Oriented to person, place, and time. Epicritic sensation to light touch grossly present bilaterally.  Dermatologic Skin healing well without signs of infection. Skin edges well coapted without signs of infection.  Orthopedic: She has very little edema or tenderness to palpation noted about the surgical site.   Multiple view plain film radiographs: Good correction of osteotomy noted with 2 screw fixation no complications noted Assessment:   1. Hallux valgus, left   2. Acquired hallux valgus of both feet    Plan:  Patient was evaluated and treated and all questions answered.  S/p foot surgery left -Progressing as expected post-operatively. -XR: Noted above -WB Status: WBAT in cam walker boot -Sutures: She returns in 9 days for suture removal. -Medications: No refills required -Foot redressed.  She may remove dressing on Monday and shower  No follow-ups on file.

## 2022-04-02 ENCOUNTER — Ambulatory Visit (INDEPENDENT_AMBULATORY_CARE_PROVIDER_SITE_OTHER): Payer: Managed Care, Other (non HMO) | Admitting: Podiatry

## 2022-04-02 DIAGNOSIS — Z9889 Other specified postprocedural states: Secondary | ICD-10-CM

## 2022-04-02 NOTE — Progress Notes (Signed)
   Chief Complaint  Patient presents with   Post-op Follow-up    Patient is here for post op follow-up #2 DOS 03/18/2022 BUNINOECTOMY W/OSTEOTOMY LT    Subjective:  Patient presents today status post bunionectomy with first metatarsal osteotomy left. DOS: 03/18/2022.  Patient doing well.  She feels well.  She has minimal pain.  She is weightbearing as tolerated in the cam boot.  No new complaints at this time  Past Medical History:  Diagnosis Date   Asthma     No past surgical history on file.  No Known Allergies  Objective/Physical Exam Neurovascular status intact.  Skin incisions appear to be well coapted with sutures intact. No sign of infectious process noted. No dehiscence. No active bleeding noted. Moderate edema noted to the surgical extremity.  Radiographic Exam LT foot 03/24/2022:  Orthopedic hardware and osteotomies sites appear to be stable with routine healing.  Reduction of the IM angle.  Good overall alignment of the first ray  Assessment: 1. s/p bunionectomy with first metatarsal osteotomy left. DOS: 03/18/2022   Plan of Care:  1. Patient was evaluated. X-rays reviewed 2.  Patient doing well.  Continue WBAT cam boot. 3.  Sutures removed 4.  Compression ankle sleeves dispensed.  Wear daily 5.  Return to clinic 2 weeks for follow-up x-ray   Edrick Kins, DPM [facility]  Dr. Edrick Kins, DPM    [address], [place] [address]                Office [phone]  Fax [phone]

## 2022-04-07 ENCOUNTER — Telehealth: Payer: Self-pay | Admitting: Podiatry

## 2022-04-07 NOTE — Telephone Encounter (Signed)
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 Patient will come pick up.

## 2022-04-07 NOTE — Telephone Encounter (Signed)
That's fine. Thanks, Dr. Amalia Hailey

## 2022-04-08 ENCOUNTER — Encounter: Payer: Self-pay | Admitting: Podiatry

## 2022-04-08 NOTE — Telephone Encounter (Signed)
Wrote note for patient called and let her know it was done.

## 2022-04-16 ENCOUNTER — Ambulatory Visit (INDEPENDENT_AMBULATORY_CARE_PROVIDER_SITE_OTHER): Payer: Managed Care, Other (non HMO)

## 2022-04-16 ENCOUNTER — Ambulatory Visit (INDEPENDENT_AMBULATORY_CARE_PROVIDER_SITE_OTHER): Payer: Managed Care, Other (non HMO) | Admitting: Podiatry

## 2022-04-16 DIAGNOSIS — Z9889 Other specified postprocedural states: Secondary | ICD-10-CM

## 2022-04-16 DIAGNOSIS — M2012 Hallux valgus (acquired), left foot: Secondary | ICD-10-CM

## 2022-04-16 NOTE — Progress Notes (Signed)
   Chief Complaint  Patient presents with   Post-op Follow-up    DOS 03/18/2022 BUNINOECTOMY W/OSTEOTOMY LT    Subjective:  Patient presents today status post bunionectomy with first metatarsal osteotomy left. DOS: 03/18/2022.  Patient continues to do well.  WBAT in the cam boot as instructed.  No new complaints at this time  Past Medical History:  Diagnosis Date   Asthma     No past surgical history on file.  No Known Allergies  Objective/Physical Exam Neurovascular status intact.  Skin incisions healed with exception of the small focal area that has scabbed over over the distal incision site.  Currently no drainage.  No erythema around the area.  This should heal uneventfully  Radiographic Exam LT foot 04/16/2022:  Orthopedic hardware and osteotomies sites appear to be stable with routine healing.  Reduction of the IM angle.  Good overall alignment of the first ray  Assessment: 1. s/p bunionectomy with first metatarsal osteotomy left. DOS: 03/18/2022 -Patient evaluated.  X-rays reviewed. 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 -Return to clinic 4 to 6 weeks for follow-up x-ray   Edrick Kins, DPM [facility]  Dr. Edrick Kins, DPM    [address], [place] [address]                Office [phone]  Fax [phone]

## 2022-05-25 ENCOUNTER — Encounter: Payer: Self-pay | Admitting: Podiatry

## 2022-05-25 ENCOUNTER — Ambulatory Visit (INDEPENDENT_AMBULATORY_CARE_PROVIDER_SITE_OTHER): Payer: Managed Care, Other (non HMO)

## 2022-05-25 ENCOUNTER — Other Ambulatory Visit: Payer: Self-pay | Admitting: Podiatry

## 2022-05-25 ENCOUNTER — Ambulatory Visit (INDEPENDENT_AMBULATORY_CARE_PROVIDER_SITE_OTHER): Payer: Managed Care, Other (non HMO) | Admitting: Podiatry

## 2022-05-25 VITALS — BP 116/74

## 2022-05-25 DIAGNOSIS — Z9889 Other specified postprocedural states: Secondary | ICD-10-CM

## 2022-05-25 NOTE — Progress Notes (Signed)
   Chief Complaint  Patient presents with   Routine Post Op    DOS 03/18/2022 BUNINOECTOMY W/OSTEOTOMY LT, patient denies any pain, NO N/V/F/C/SOB, X-rays taken today    Subjective:  Patient presents today status post bunionectomy with first metatarsal osteotomy left. DOS: 03/18/2022.  Patient is doing very well.  She is in tennis shoes.  She has no pain to her feet.  Presenting for final follow-up treatment evaluation  Past Medical History:  Diagnosis Date   Asthma     No past surgical history on file.  No Known Allergies  Objective/Physical Exam Neurovascular status intact.  Skin incisions healed.  There continues to be some very mild to moderate edema around the first MTP of the left foot.  No erythema With weightbearing there is some increased space between the great toe and the second toe of the right foot.  No pain however.  Radiographic Exam LT foot 05/25/2022:  Orthopedic hardware and osteotomies sites appear to be stable with routine healing.  Reduction of the IM angle.  Good overall alignment of the first ray  Assessment: 1. s/p bunionectomy with first metatarsal osteotomy left. DOS: 03/18/2022 -Patient evaluated.  X-rays reviewed. - Patient may now resume full activity no restrictions -Return to work full activity no restrictions -Return to clinic as needed   Edrick Kins, DPM [facility]  Dr. Edrick Kins, DPM    [address], [place] [address]                Office [phone]  Fax [phone]

## 2022-06-10 ENCOUNTER — Ambulatory Visit: Payer: Managed Care, Other (non HMO) | Admitting: Certified Nurse Midwife

## 2022-06-10 ENCOUNTER — Encounter: Payer: Self-pay | Admitting: Certified Nurse Midwife

## 2022-06-10 ENCOUNTER — Other Ambulatory Visit (HOSPITAL_COMMUNITY)
Admission: RE | Admit: 2022-06-10 | Discharge: 2022-06-10 | Disposition: A | Discharge status: Home / Self Care | Payer: Managed Care, Other (non HMO) | Source: Ambulatory Visit | Attending: Certified Nurse Midwife | Admitting: Certified Nurse Midwife

## 2022-06-10 VITALS — BP 116/77 | HR 89 | Resp 16 | Ht 64.0 in | Wt 153.1 lb

## 2022-06-10 DIAGNOSIS — Z01419 Encounter for gynecological examination (general) (routine) without abnormal findings: Secondary | ICD-10-CM | POA: Diagnosis present

## 2022-06-10 DIAGNOSIS — Z124 Encounter for screening for malignant neoplasm of cervix: Secondary | ICD-10-CM | POA: Insufficient documentation

## 2022-06-10 NOTE — Progress Notes (Signed)
GYNECOLOGY ANNUAL PREVENTATIVE CARE ENCOUNTER NOTE  History:     Robin Blankenship is a 32 y.o. No obstetric history on file. female here for a routine annual gynecologic exam.  Current complaints: trying to conceive this year. .   Denies abnormal vaginal bleeding, discharge, pelvic pain, problems with intercourse or other gynecologic concerns.     Social Relationship: Married  Living: with spouse  Work: Producer, television/film/video , environmental health Exercise: gym 5 x wk. Smoke/Alcohol/drug use: wine 2 glasses nightly, denies smoking/drug use.   Gynecologic History No LMP recorded. Contraception: none Last Pap: 09/10/2019. Results were: normal Last mammogram: n/a .    The pregnancy intention screening data noted above was reviewed. Potential methods of contraception were discussed. The patient elected to proceed with No data recorded.   Obstetric History OB History  No obstetric history on file.    Past Medical History:  Diagnosis Date   Asthma     History reviewed. No pertinent surgical history.  Current Outpatient Medications on File Prior to Visit  Medication Sig Dispense Refill   albuterol (VENTOLIN HFA) 108 (90 Base) MCG/ACT inhaler Inhale into the lungs every 6 (six) hours as needed for wheezing or shortness of breath.     albuterol (VENTOLIN HFA) 108 (90 Base) MCG/ACT inhaler Inhale 2 puffs into the lungs every 6 (six) hours as needed for wheezing or shortness of breath. (Patient not taking: Reported on 10/16/2020) 8 g 2   Cholecalciferol (VITAMIN D3 PO) Take by mouth.     diclofenac (VOLTAREN) 75 MG EC tablet Take 1 tablet (75 mg total) by mouth 2 (two) times Blankenship. 60 tablet 1   loratadine (CLARITIN) 10 MG tablet Take 10 mg by mouth Blankenship.     Multiple Vitamin (MULTIVITAMIN ADULT PO) Take by mouth.     mupirocin ointment (BACTROBAN) 2 % Apply 1 application topically 2 (two) times Blankenship. 22 g 0   oxyCODONE-acetaminophen (PERCOCET) 5-325 MG tablet Take 1 tablet by  mouth every 4 (four) hours as needed for severe pain. 30 tablet 0   No current facility-administered medications on file prior to visit.    No Known Allergies  Social History:  reports that she has quit smoking. Her smoking use included cigarettes. She quit smokeless tobacco use about 10 years ago. She reports current alcohol use. She reports that she does not use drugs.  Family History  Problem Relation Age of Onset   Heart disease Father    Diabetes Father    Stroke Father    Cancer Paternal Grandmother        breast    Diabetes Paternal Grandmother    Stroke Paternal Grandfather    Dementia Maternal Grandmother     The following portions of the patient's history were reviewed and updated as appropriate: allergies, current medications, past family history, past medical history, past social history, past surgical history and problem list.  Review of Systems Pertinent items noted in HPI and remainder of comprehensive ROS otherwise negative.  Physical Exam:  There were no vitals taken for this visit. CONSTITUTIONAL: Well-developed, well-nourished female in no acute distress.  HENT:  Normocephalic, atraumatic, External right and left ear normal. Oropharynx is clear and moist EYES: Conjunctivae and EOM are normal. Pupils are equal, round, and reactive to light. No scleral icterus.  NECK: Normal range of motion, supple, no masses.  Normal thyroid.  SKIN: Skin is warm and dry. No rash noted. Not diaphoretic. No erythema. No pallor. MUSCULOSKELETAL:  Normal range of motion. No tenderness.  No cyanosis, clubbing, or edema.  2+ distal pulses. NEUROLOGIC: Alert and oriented to person, place, and time. Normal reflexes, muscle tone coordination.  PSYCHIATRIC: Normal mood and affect. Normal behavior. Normal judgment and thought content. CARDIOVASCULAR: Normal heart rate noted, regular rhythm RESPIRATORY: Clear to auscultation bilaterally. Effort and breath sounds normal, no problems with  respiration noted. BREASTS: Symmetric in size. No masses, tenderness, skin changes, nipple drainage, or lymphadenopathy bilaterally.  ABDOMEN: Soft, no distention noted.  No tenderness, rebound or guarding.  PELVIC: Normal appearing external genitalia and urethral meatus; normal appearing vaginal mucosa and cervix.  No abnormal discharge noted.  Pap smear obtained.  Normal uterine size, no other palpable masses, no uterine or adnexal tenderness.  .   Assessment and Plan:    1. Women's annual routine gynecological examination  Pap: Will follow up results of pap smear and manage accordingly. Mammogram : n/a  Labs: pt declines Refills: none Referral: none Health form completed for pt.   Preconception counseling completed. All questions answered. Routine preventative health maintenance measures emphasized. Please refer to After Visit Summary for other counseling recommendations.      Philip Aspen, [facility]

## 2022-06-10 NOTE — Patient Instructions (Signed)

## 2022-06-15 LAB — CYTOLOGY - PAP
Comment: NEGATIVE
Diagnosis: NEGATIVE
High risk HPV: NEGATIVE

## 2022-08-15 NOTE — Progress Notes (Signed)
New Patient Note  RE: Robin Blankenship MRN: [id number] DOB: [birth year] Date of Office Visit: 08/16/2022  Consult requested by: Sherlene Shams, MD Primary care provider: Sherlene Shams, MD  Chief Complaint: No chief complaint on file.  History of Present Illness: I had the pleasure of seeing Robin Blankenship for initial evaluation at the [facility] on 08/15/2022. She is a 32 y.o. female, who is referred here by Sherlene Shams, MD for the evaluation of ***.  ***  Assessment and Plan: Robin Blankenship is a 32 y.o. female with: No problem-specific Assessment & Plan notes found for this encounter.  No follow-ups on file.  No orders of the defined types were placed in this encounter.  Lab Orders  No laboratory test(s) ordered today    Other allergy screening: Asthma: {Blank single:19197::"yes","no"} Rhino conjunctivitis: {Blank single:19197::"yes","no"} Food allergy: {Blank single:19197::"yes","no"} Medication allergy: {Blank single:19197::"yes","no"} Hymenoptera allergy: {Blank single:19197::"yes","no"} Urticaria: {Blank single:19197::"yes","no"} Eczema:{Blank single:19197::"yes","no"} History of recurrent infections suggestive of immunodeficency: {Blank single:19197::"yes","no"}  Diagnostics: Spirometry:  Tracings reviewed. Her effort: {Blank single:19197::"Good reproducible efforts.","It was hard to get consistent efforts and there is a question as to whether this reflects a maximal maneuver.","Poor effort, data can not be interpreted."} FVC: ***L FEV1: ***L, ***% predicted FEV1/FVC ratio: ***% Interpretation: {Blank single:19197::"Spirometry consistent with mild obstructive disease","Spirometry consistent with moderate obstructive disease","Spirometry consistent with severe obstructive disease","Spirometry consistent with possible restrictive disease","Spirometry consistent with mixed obstructive and restrictive disease","Spirometry uninterpretable due to  technique","Spirometry consistent with normal pattern","No overt abnormalities noted given today's efforts"}.  Please see scanned spirometry results for details.  Skin Testing: {Blank single:19197::"Select foods","Environmental allergy panel","Environmental allergy panel and select foods","Food allergy panel","None","Deferred due to recent antihistamines use"}. *** Results discussed with patient/family.   Past Medical History: Patient Active Problem List   Diagnosis Date Noted  . Bilateral bunions 05/13/2014  . Routine general medical examination at a health care facility 10/23/2013  . Contraceptive management 07/25/2012  . Pilonidal cyst 09/21/2011  . Asthma, exercise induced 08/03/2011  . Premenstrual syndrome 08/03/2011   Past Medical History:  Diagnosis Date  . Allergic reaction to alpha-gal   . Asthma   . [place] spotted fever    Past Surgical History: No past surgical history on file. Medication List:  Current Outpatient Medications  Medication Sig Dispense Refill  . albuterol (VENTOLIN HFA) 108 (90 Base) MCG/ACT inhaler Inhale into the lungs every 6 (six) hours as needed for wheezing or shortness of breath.    . fexofenadine (ALLEGRA) 180 MG tablet 1 tablet Swallow whole with water; do not take with fruit juices. Orally Once a day     No current facility-administered medications for this visit.   Allergies: No Known Allergies Social History: Social History   Socioeconomic History  . Marital status: Married    Spouse name: Not on file  . Number of children: Not on file  . Years of education: Not on file  . Highest education level: Not on file  Occupational History  . Not on file  Tobacco Use  . Smoking status: Former    Types: Cigarettes  . Smokeless tobacco: Former    Quit date: 09/18/2011  Substance and Sexual Activity  . Alcohol use: Yes    Comment: GLASS OF WINE DAILY  . Drug use: No  . Sexual activity: Yes    Birth control/protection: Condom   Other Topics Concern  . Not on file  Social History Narrative   ** Merged History Encounter **  Social Determinants of Health   Financial Resource Strain: Not on file  Food Insecurity: Not on file  Transportation Needs: Not on file  Physical Activity: Not on file  Stress: Not on file  Social Connections: Not on file   Lives in a ***. Smoking: *** Occupation: ***  Environmental HistorySurveyor, minerals in the house: Copywriter, advertising in the family room: {Blank single:19197::"yes","no"} Carpet in the bedroom: {Blank single:19197::"yes","no"} Heating: {Blank single:19197::"electric","gas","heat pump"} Cooling: {Blank single:19197::"central","window","heat pump"} Pet: {Blank single:19197::"yes ***","no"}  Family History: Family History  Problem Relation Age of Onset  . Heart disease Father   . Diabetes Father   . Stroke Father   . Cancer Paternal Grandmother        breast   . Diabetes Paternal Grandmother   . Stroke Paternal Grandfather   . Dementia Maternal Grandmother    Problem                               Relation Asthma                                   *** Eczema                                *** Food allergy                          *** Allergic rhino conjunctivitis     ***  Review of Systems  Constitutional:  Negative for appetite change, chills, fever and unexpected weight change.  HENT:  Negative for congestion and rhinorrhea.   Eyes:  Negative for itching.  Respiratory:  Negative for cough, chest tightness, shortness of breath and wheezing.   Cardiovascular:  Negative for chest pain.  Gastrointestinal:  Negative for abdominal pain.  Genitourinary:  Negative for difficulty urinating.  Skin:  Negative for rash.  Neurological:  Negative for headaches.   Objective: There were no vitals taken for this visit. There is no height or weight on file to calculate BMI. Physical Exam Vitals and nursing note reviewed.   Constitutional:      Appearance: Normal appearance. She is well-developed.  HENT:     Head: Normocephalic and atraumatic.     Right Ear: Tympanic membrane and external ear normal.     Left Ear: Tympanic membrane and external ear normal.     Nose: Nose normal.     Mouth/Throat:     Mouth: Mucous membranes are moist.     Pharynx: Oropharynx is clear.  Eyes:     Conjunctiva/sclera: Conjunctivae normal.  Cardiovascular:     Rate and Rhythm: Normal rate and regular rhythm.     Heart sounds: Normal heart sounds. No murmur heard.    No friction rub. No gallop.  Pulmonary:     Effort: Pulmonary effort is normal.     Breath sounds: Normal breath sounds. No wheezing, rhonchi or rales.  Musculoskeletal:     Cervical back: Neck supple.  Skin:    General: Skin is warm.     Findings: No rash.  Neurological:     Mental Status: She is alert and oriented to person, place, and time.  Psychiatric:        Behavior: Behavior normal.  The plan was reviewed with the  patient/family, and all questions/concerned were addressed.  It was my pleasure to see Robin Blankenship today and participate in her care. Please feel free to contact me with any questions or concerns.  Sincerely,  Wyline Mood, DO Allergy & Immunology  [facility] office: [phone] [facility] office: [phone]

## 2022-08-16 ENCOUNTER — Ambulatory Visit (INDEPENDENT_AMBULATORY_CARE_PROVIDER_SITE_OTHER): Payer: Managed Care, Other (non HMO) | Admitting: Allergy

## 2022-08-16 ENCOUNTER — Encounter: Payer: Self-pay | Admitting: Allergy

## 2022-08-16 ENCOUNTER — Other Ambulatory Visit: Payer: Self-pay

## 2022-08-16 VITALS — BP 118/68 | HR 98 | Temp 98.3°F | Resp 16 | Ht 63.78 in | Wt 151.3 lb

## 2022-08-16 DIAGNOSIS — Z91018 Allergy to other foods: Secondary | ICD-10-CM | POA: Insufficient documentation

## 2022-08-16 DIAGNOSIS — J3089 Other allergic rhinitis: Secondary | ICD-10-CM | POA: Diagnosis not present

## 2022-08-16 DIAGNOSIS — H1013 Acute atopic conjunctivitis, bilateral: Secondary | ICD-10-CM | POA: Insufficient documentation

## 2022-08-16 DIAGNOSIS — J4530 Mild persistent asthma, uncomplicated: Secondary | ICD-10-CM | POA: Diagnosis not present

## 2022-08-16 MED ORDER — ALBUTEROL SULFATE HFA 108 (90 BASE) MCG/ACT IN AERS: 2.0000 | INHALATION_SPRAY | RESPIRATORY_TRACT | 1 refills | Status: DC | PRN

## 2022-08-16 MED ORDER — PULMICORT FLEXHALER 90 MCG/ACT IN AEPB: 2.0000 | INHALATION_SPRAY | Freq: Two times a day (BID) | RESPIRATORY_TRACT | 3 refills | Status: DC

## 2022-08-16 NOTE — Assessment & Plan Note (Signed)
Diagnosed with asthma over 10 years ago. The last few weeks noted increased in symptoms and needing to use albuterol 2-4 times per week with some benefit. Questioning if allergies flaring her symptoms. Covid-19 in 2023. Denies reflux/heartburn. Trying to get pregnant.  Today's skin testing showed: Positive to grass, trees, weed, mold, dust mites, dog. Negative to select foods.  Normal spirometry today but just used albuterol 2 hours before visit.  Daily controller medication(s): start Pulmicort 2 puffs twice a day with spacer and rinse mouth afterwards. Let me know if not covered - may have to write a letter to allow this during pregnancy.  During respiratory infections/flares:  Increase Pulmicort to 4 puffs twice a day for 1-2 weeks until your breathing symptoms return to baseline.  Pretreat with albuterol 2 puffs. May use albuterol rescue inhaler 2 puffs every 4 to 6 hours as needed for shortness of breath, chest tightness, coughing, and wheezing. May use albuterol rescue inhaler 2 puffs 5 to 15 minutes prior to strenuous physical activities. Monitor frequency of use.  Get spirometry at next visit.

## 2022-08-16 NOTE — Patient Instructions (Addendum)
Today's skin testing showed: Positive to grass, trees, weed, mold, dust mites, dog. Negative to select foods.   Results given.  Environmental allergies Start environmental control measures as below. Use over the counter antihistamines such as Zyrtec (cetirizine), Claritin (loratadine), Allegra (fexofenadine), or Xyzal (levocetirizine) daily as needed. May take twice a day during allergy flares. May switch antihistamines every few months. Try Flonase sensamist 1 spray per nostril once a day. Nasal saline spray (i.e., Simply Saline) or nasal saline lavage (i.e., NeilMed) is recommended as needed and prior to medicated nasal sprays. May use refresh eye drops.  Allergy eye drops cause worsening dry eyes.  Consider allergy injections for long term control if above medications do not help the symptoms - handout given.  Consider once done with first pregnancy.   Breathing Normal breathing test today but you just used your inhaler. Daily controller medication(s): start Pulmicort 2 puffs twice a day with spacer and rinse mouth afterwards. Let me know if not covered.  During respiratory infections/flares:  Increase Pulmicort to 4 puffs twice a day for 1-2 weeks until your breathing symptoms return to baseline.  Pretreat with albuterol 2 puffs. May use albuterol rescue inhaler 2 puffs every 4 to 6 hours as needed for shortness of breath, chest tightness, coughing, and wheezing. May use albuterol rescue inhaler 2 puffs 5 to 15 minutes prior to strenuous physical activities. Monitor frequency of use.  Breathing control goals:  Full participation in all desired activities (may need albuterol before activity) Albuterol use two times or less a week on average (not counting use with activity) Cough interfering with sleep two times or less a month Oral steroids no more than once a year No hospitalizations  Alpha-gal allergy Continue to avoid red meat. See handout. Avoid future tick bites.   For mild symptoms you can take over the counter antihistamines such as Benadryl and monitor symptoms closely. If symptoms worsen or if you have severe symptoms including breathing issues, throat closure, significant swelling, whole body hives, severe diarrhea and vomiting, lightheadedness then seek immediate medical care.  Follow up in 2 months or sooner if needed.    Reducing Pollen Exposure Pollen seasons: trees (spring), grass (summer) and ragweed/weeds (fall). Keep windows closed in your home and car to lower pollen exposure.  Install air conditioning in the bedroom and throughout the house if possible.  Avoid going out in dry windy days - especially early morning. Pollen counts are highest between 5 - 10 AM and on dry, hot and windy days.  Save outside activities for late afternoon or after a heavy rain, when pollen levels are lower.  Avoid mowing of grass if you have grass pollen allergy. Be aware that pollen can also be transported indoors on people and pets.  Dry your clothes in an automatic dryer rather than hanging them outside where they might collect pollen.  Rinse hair and eyes before bedtime.  Control of House Dust Mite Allergen Dust mite allergens are a common trigger of allergy and asthma symptoms. While they can be found throughout the house, these microscopic creatures thrive in warm, humid environments such as bedding, upholstered furniture and carpeting. Because so much time is spent in the bedroom, it is essential to reduce mite levels there.  Encase pillows, mattresses, and box springs in special allergen-proof fabric covers or airtight, zippered plastic covers.  Bedding should be washed weekly in hot water (130 F) and dried in a hot dryer. Allergen-proof covers are available for comforters and  pillows that can't be regularly washed.  Wash the allergy-proof covers every few months. Minimize clutter in the bedroom. Keep pets out of the bedroom.  Keep humidity less than  50% by using a dehumidifier or air conditioning. You can buy a humidity measuring device called a hygrometer to monitor this.  If possible, replace carpets with hardwood, linoleum, or washable area rugs. If that's not possible, vacuum frequently with a vacuum that has a HEPA filter. Remove all upholstered furniture and non-washable window drapes from the bedroom. Remove all non-washable stuffed toys from the bedroom.  Wash stuffed toys weekly. Pet Allergen Avoidance: Contrary to popular opinion, there are no "hypoallergenic" breeds of dogs or cats. That is because people are not allergic to an animal's hair, but to an allergen found in the animal's saliva, dander (dead skin flakes) or urine. Pet allergy symptoms typically occur within minutes. For some people, symptoms can build up and become most severe 8 to 12 hours after contact with the animal. People with severe allergies can experience reactions in public places if dander has been transported on the pet owners' clothing. Keeping an animal outdoors is only a partial solution, since homes with pets in the yard still have higher concentrations of animal allergens. Before getting a pet, ask your allergist to determine if you are allergic to animals. If your pet is already considered part of your family, try to minimize contact and keep the pet out of the bedroom and other rooms where you spend a great deal of time. As with dust mites, vacuum carpets often or replace carpet with a hardwood floor, tile or linoleum. High-efficiency particulate air (HEPA) cleaners can reduce allergen levels over time. While dander and saliva are the source of cat and dog allergens, urine is the source of allergens from rabbits, hamsters, mice and [place] pigs; so ask a non-allergic family member to clean the animal's cage. If you have a pet allergy, talk to your allergist about the potential for allergy immunotherapy (allergy shots). This strategy can often provide long-term  relief. Mold Control Mold and fungi can grow on a variety of surfaces provided certain temperature and moisture conditions exist.  Outdoor molds grow on plants, decaying vegetation and soil. The major outdoor mold, Alternaria and Cladosporium, are found in very high numbers during hot and dry conditions. Generally, a late summer - fall peak is seen for common outdoor fungal spores. Rain will temporarily lower outdoor mold spore count, but counts rise rapidly when the rainy period ends. The most important indoor molds are Aspergillus and Penicillium. Dark, humid and poorly ventilated basements are ideal sites for mold growth. The next most common sites of mold growth are the bathroom and the kitchen. Outdoor (Seasonal) Mold Control Use air conditioning and keep windows closed. Avoid exposure to decaying vegetation. Avoid leaf raking. Avoid grain handling. Consider wearing a face mask if working in moldy areas.  Indoor (Perennial) Mold Control  Maintain humidity below 50%. Get rid of mold growth on hard surfaces with water, detergent and, if necessary, 5% bleach (do not mix with other cleaners). Then dry the area completely. If mold covers an area more than 10 square feet, consider hiring an indoor environmental professional. For clothing, washing with soap and water is best. If moldy items cannot be cleaned and dried, throw them away. Remove sources e.g. contaminated carpets. Repair and seal leaking roofs or pipes. Using dehumidifiers in damp basements may be helpful, but empty the water and clean units regularly to prevent  mildew from forming. All rooms, especially basements, bathrooms and kitchens, require ventilation and cleaning to deter mold and mildew growth. Avoid carpeting on concrete or damp floors, and storing items in damp areas.

## 2022-08-16 NOTE — Assessment & Plan Note (Signed)
Rhinoconjunctivitis symptoms mainly in the spring and summer.  Tried over-the-counter Allegra and Sudafed with some benefit.  Does not like to use nasal sprays.  No prior allergy/ENT evaluation. Try to get pregnant.  Today's skin testing showed: Positive to grass, trees, weed, mold, dust mites, dog. Start environmental control measures as below. Use over the counter antihistamines such as Zyrtec (cetirizine), Claritin (loratadine), Allegra (fexofenadine), or Xyzal (levocetirizine) daily as needed. May take twice a day during allergy flares. May switch antihistamines every few months. Try Flonase sensamist 1 spray per nostril once a day. Nasal saline spray (i.e., Simply Saline) or nasal saline lavage (i.e., NeilMed) is recommended as needed and prior to medicated nasal sprays. May use refresh eye drops.  Allergy eye drops cause worsening dry eyes.  Consider allergy injections for long term control if above medications do not help the symptoms - handout given.  Consider once done with first pregnancy.

## 2022-08-16 NOTE — Assessment & Plan Note (Signed)
Noted bloating after eating red meat within 1 hour. No rash/hives. 2024 bloodwork done by [facility] was positive to alpha-gal panel.  Today's skin testing showed: Negative to select foods.  Continue to avoid red meat. See handout. Avoid future tick bites.  For mild symptoms you can take over the counter antihistamines such as Benadryl and monitor symptoms closely. If symptoms worsen or if you have severe symptoms including breathing issues, throat closure, significant swelling, whole body hives, severe diarrhea and vomiting, lightheadedness then seek immediate medical care.

## 2022-08-16 NOTE — Assessment & Plan Note (Signed)
.   See assessment and plan as above. 

## 2022-08-20 ENCOUNTER — Ambulatory Visit: Payer: Managed Care, Other (non HMO) | Admitting: Internal Medicine

## 2022-09-02 ENCOUNTER — Telehealth: Payer: Self-pay | Admitting: *Deleted

## 2022-09-02 ENCOUNTER — Other Ambulatory Visit (HOSPITAL_COMMUNITY): Payer: Self-pay

## 2022-09-02 NOTE — Telephone Encounter (Signed)
Insurance preferred are Asmanex Twisthaler (mometasone) and Qvar Redihaler (beclomethasone). Are either of these appropriate to change to? If not, please advise why patient unable to use for PA documentation and submission.

## 2022-09-02 NOTE — Telephone Encounter (Signed)
Patient called and inquired about the Pulmicort Inhaler stating that she has not received any information about the medication being ready for pickup. I called the pharmacy and they stated that they needed a Prior Authorization. I advised the patient that we would work on that and keep her in touch of the status. Can we please work on a prior authorization for Pulmicort Flexhaler? Patient has tried and failed Symbicort 160.

## 2022-09-03 MED ORDER — QVAR REDIHALER 80 MCG/ACT IN AERB: 2.0000 | INHALATION_SPRAY | Freq: Two times a day (BID) | RESPIRATORY_TRACT | 3 refills | Status: DC

## 2022-09-03 NOTE — Telephone Encounter (Signed)
Sent in Qvar 2 puffs BID.

## 2022-09-03 NOTE — Addendum Note (Signed)
Addended by: Ellamae Sia on: 09/03/2022 09:41 AM   Modules accepted: Orders

## 2022-11-10 ENCOUNTER — Ambulatory Visit: Payer: Managed Care, Other (non HMO) | Admitting: Allergy

## 2023-05-04 NOTE — L&D Delivery Note (Signed)
 Delivery Note Patient pushed well for 2 hours.  At 8:09 AM a viable and healthy female was delivered via Vaginal, Spontaneous (Presentation:  OA  ).  APGAR: 7, 9; weight  pending.   Placenta status: Spontaneous, Intact.  Cord: 3 vessels with the following complications: None.  Cord pH: NA  Anesthesia: Epidural Episiotomy:  none Lacerations: Perineal;Vaginal;1st degree Suture Repair: 3.0 vicryl rapide Est. Blood Loss (mL):  75 cc  Mom to postpartum.  Baby to Couplet care / Skin to Skin.  [name] 03/04/2024, 8:49 AM

## 2023-08-03 LAB — OB RESULTS CONSOLE HEPATITIS B SURFACE ANTIGEN: Hepatitis B Surface Ag: NEGATIVE

## 2023-08-03 LAB — HEPATITIS C ANTIBODY: HCV Ab: NEGATIVE

## 2023-08-03 LAB — OB RESULTS CONSOLE RUBELLA ANTIBODY, IGM: Rubella: IMMUNE

## 2023-08-03 LAB — OB RESULTS CONSOLE HIV ANTIBODY (ROUTINE TESTING): HIV: NONREACTIVE

## 2023-08-03 LAB — OB RESULTS CONSOLE RPR: RPR: NONREACTIVE

## 2023-08-24 ENCOUNTER — Encounter: Payer: Self-pay | Admitting: Dermatology

## 2023-08-24 ENCOUNTER — Ambulatory Visit (INDEPENDENT_AMBULATORY_CARE_PROVIDER_SITE_OTHER): Admitting: Dermatology

## 2023-08-24 DIAGNOSIS — W908XXA Exposure to other nonionizing radiation, initial encounter: Secondary | ICD-10-CM | POA: Diagnosis not present

## 2023-08-24 DIAGNOSIS — L814 Other melanin hyperpigmentation: Secondary | ICD-10-CM | POA: Diagnosis not present

## 2023-08-24 DIAGNOSIS — L905 Scar conditions and fibrosis of skin: Secondary | ICD-10-CM

## 2023-08-24 DIAGNOSIS — B078 Other viral warts: Secondary | ICD-10-CM

## 2023-08-24 DIAGNOSIS — Z808 Family history of malignant neoplasm of other organs or systems: Secondary | ICD-10-CM

## 2023-08-24 DIAGNOSIS — B079 Viral wart, unspecified: Secondary | ICD-10-CM | POA: Diagnosis not present

## 2023-08-24 DIAGNOSIS — Z1283 Encounter for screening for malignant neoplasm of skin: Secondary | ICD-10-CM

## 2023-08-24 DIAGNOSIS — L578 Other skin changes due to chronic exposure to nonionizing radiation: Secondary | ICD-10-CM | POA: Diagnosis not present

## 2023-08-24 DIAGNOSIS — L700 Acne vulgaris: Secondary | ICD-10-CM

## 2023-08-24 DIAGNOSIS — D1801 Hemangioma of skin and subcutaneous tissue: Secondary | ICD-10-CM

## 2023-08-24 DIAGNOSIS — L719 Rosacea, unspecified: Secondary | ICD-10-CM

## 2023-08-24 DIAGNOSIS — L821 Other seborrheic keratosis: Secondary | ICD-10-CM

## 2023-08-24 DIAGNOSIS — D225 Melanocytic nevi of trunk: Secondary | ICD-10-CM

## 2023-08-24 DIAGNOSIS — Q825 Congenital non-neoplastic nevus: Secondary | ICD-10-CM

## 2023-08-24 DIAGNOSIS — D229 Melanocytic nevi, unspecified: Secondary | ICD-10-CM

## 2023-08-24 DIAGNOSIS — D2262 Melanocytic nevi of left upper limb, including shoulder: Secondary | ICD-10-CM

## 2023-08-24 DIAGNOSIS — L810 Postinflammatory hyperpigmentation: Secondary | ICD-10-CM

## 2023-08-24 MED ORDER — AZELAIC ACID 15 % EX GEL: CUTANEOUS | 5 refills | Status: DC

## 2023-08-24 NOTE — Progress Notes (Signed)
 Follow-Up Visit   Subjective  Robin Blankenship is a 33 y.o. female who presents for the following: Skin Cancer Screening and Full Body Skin Exam  The patient presents for Total-Body Skin Exam (TBSE) for skin cancer screening and mole check. The patient has spots, moles and lesions to be evaluated, some may be new or changing.  She has a wart on her finger she would like treated.   Patient is pregnant, just entering 2nd trimester.  The following portions of the chart were reviewed this encounter and updated as appropriate: medications, allergies, medical history  Review of Systems:  No other skin or systemic complaints except as noted in HPI or Assessment and Plan.  Objective  Well appearing patient in no apparent distress; mood and affect are within normal limits.  A full examination was performed including scalp, head, eyes, ears, nose, lips, neck, chest, axillae, abdomen, back, buttocks, bilateral upper extremities, bilateral lower extremities, hands, feet, fingers, toes, fingernails, and toenails. All findings within normal limits unless otherwise noted below.   Relevant physical exam findings are noted in the Assessment and Plan.       Assessment & Plan   SKIN CANCER SCREENING PERFORMED TODAY.  ACTINIC DAMAGE - Chronic condition, secondary to cumulative UV/sun exposure - diffuse scaly erythematous macules with underlying dyspigmentation - Recommend daily broad spectrum sunscreen SPF 30+ to sun-exposed areas, reapply every 2 hours as needed.  - Staying in the shade or wearing long sleeves, sun glasses (UVA+UVB protection) and wide brim hats (4-inch brim around the entire circumference of the hat) are also recommended for sun protection.  - Call for new or changing lesions.  LENTIGINES, SEBORRHEIC KERATOSES, HEMANGIOMAS - Benign normal skin lesions - Benign-appearing - Call for any changes  MELANOCYTIC NEVI - Tan-brown and/or pink-flesh-colored symmetric macules and  papules, stable from previous visit - L post shoulder 2.5 mm medium dark brown macule   - L sacrum 2.0 mm medium dark brown macule  - L lat chest 5.0 x 3.0 mm light brown macule with dark brown 1.0 mm center, photo today - Central upper abdomen 3.0 mm tan papule  - Benign appearing on exam today - Observation - Call clinic for new or changing moles - Recommend daily use of broad spectrum spf 30+ sunscreen to sun-exposed areas.   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  Observation.  Call clinic for new or changing lesions.  Recommend daily use of broad spectrum spf 30+ sunscreen to sun-exposed areas.     FAMILY HISTORY OF SKIN CANCER What type(s):melanoma Who affected:grandfather  PIH at [facility], secondary to bunion surgery in 2023 Exam: Hyperpigmented smooth linear patch at bilateral dorsal feet  Benign-appearing.  Observation.  Call clinic for new or changing lesions. Recommend daily broad spectrum sunscreen SPF 30+, reapply every 2 hours as needed.  Treatment: Scars remodel on their own for a full year and will gradually improve in appearance over time.  ACNE VULGARIS/ROSACEA Exam: Scattered comedones on the face, some inflamed; erythema at nose and cheeks.  Chronic and persistent condition with duration or expected duration over one year. Condition is symptomatic/ bothersome to patient. Not currently at goal.  Treatment Plan: Pt is pregnant Start azelaic acid  15% gel apply to face twice daily for acne dsp 50 g 5 Rf Sample of CeraVe SA given today.  WART Exam: 1.5 mm verrucous papule at R subungual thumb  Counseling Discussed viral / HPV (Human Papilloma Virus) etiology and  risk of spread /infectivity to other areas of body as well as to other people.  Multiple treatments and methods may be required to clear warts and it is possible treatment may not be successful.  Treatment risks include discoloration; scarring and there is  still potential for wart recurrence.  Treatment Plan: Destruction Procedure Note Destruction method: cryotherapy   Informed consent: discussed and consent obtained   Lesion destroyed using liquid nitrogen: Yes   Outcome: patient tolerated procedure well with no complications   Post-procedure details: wound care instructions given   Locations: R subungual thumb # of Lesions Treated: 1  Prior to procedure, discussed risks of blister formation, small wound, skin dyspigmentation, or rare scar following cryotherapy. Recommend Vaseline ointment to treated areas while healing.    Return in about 9 months (around 05/25/2024) for face - acne/rosacea.  IBernardine Bridegroom, CMA, am acting as scribe for Artemio Larry, MD .   Documentation: I have reviewed the above documentation for accuracy and completeness, and I agree with the above.  Artemio Larry, MD

## 2023-08-24 NOTE — Patient Instructions (Addendum)
 Azelaic Acid  15% Gel - apply to face twice daily for acne. May also apply twice daily to scars on feet twice daily. Ok to use during pregnancy.  Recommend daily broad spectrum sunscreen SPF 30+ to sun-exposed areas, reapply every 2 hours as needed. Call for new or changing lesions.  Staying in the shade or wearing long sleeves, sun glasses (UVA+UVB protection) and wide brim hats (4-inch brim around the entire circumference of the hat) are also recommended for sun protection.    Cryotherapy Aftercare  Wash gently with soap and water everyday.   Apply Vaseline and Band-Aid daily until healed.   Viral Warts & Molluscum Contagiosum  Viral warts and molluscum contagiosum are growths of the skin caused by viral infection of the skin. If you have been given the diagnosis of viral warts or molluscum contagiosum there are a few things that you must understand about your condition:  There is no guaranteed treatment method available for this condition. Multiple treatments may be required, The treatments may be time consuming and require multiple visits to the dermatology office. The treatment may be expensive. You will be charged each time you come into the office to have the spots treated. The treated areas may develop new lesions further complicating treatment. The treated areas may leave a scar. There is no guarantee that even after multiple treatments that the spots will be successfully treated. These are caused by a viral infection and can be spread to other areas of the skin and to other people by direct contact. Therefore, new spots may occur.  Melanoma ABCDEs  Melanoma is the most dangerous type of skin cancer, and is the leading cause of death from skin disease.  You are more likely to develop melanoma if you: Have light-colored skin, light-colored eyes, or red or blond hair Spend a lot of time in the sun Tan regularly, either outdoors or in a tanning bed Have had blistering sunburns,  especially during childhood Have a close family member who has had a melanoma Have atypical moles or large birthmarks  Early detection of melanoma is key since treatment is typically straightforward and cure rates are extremely high if we catch it early.   The first sign of melanoma is often a change in a mole or a new dark spot.  The ABCDE system is a way of remembering the signs of melanoma.  A for asymmetry:  The two halves do not match. B for border:  The edges of the growth are irregular. C for color:  A mixture of colors are present instead of an even brown color. D for diameter:  Melanomas are usually (but not always) greater than 6mm - the size of a pencil eraser. E for evolution:  The spot keeps changing in size, shape, and color.  Please check your skin once per month between visits. You can use a small mirror in front and a large mirror behind you to keep an eye on the back side or your body.   If you see any new or changing lesions before your next follow-up, please call to schedule a visit.  Please continue daily skin protection including broad spectrum sunscreen SPF 30+ to sun-exposed areas, reapplying every 2 hours as needed when you're outdoors.   Staying in the shade or wearing long sleeves, sun glasses (UVA+UVB protection) and wide brim hats (4-inch brim around the entire circumference of the hat) are also recommended for sun protection.     Due to recent changes in healthcare  laws, you may see results of your pathology and/or laboratory studies on MyChart before the doctors have had a chance to review them. We understand that in some cases there may be results that are confusing or concerning to you. Please understand that not all results are received at the same time and often the doctors may need to interpret multiple results in order to provide you with the best plan of care or course of treatment. Therefore, we ask that you please give us  2 business days to thoroughly  review all your results before contacting the office for clarification. Should we see a critical lab result, you will be contacted sooner.   If You Need Anything After Your Visit  If you have any questions or concerns for your doctor, please call our main line at [phone] and press option 4 to reach your doctor's medical assistant. If no one answers, please leave a voicemail as directed and we will return your call as soon as possible. Messages left after 4 pm will be answered the following business day.   You may also send us  a message via MyChart. We typically respond to MyChart messages within 1-2 business days.  For prescription refills, please ask your pharmacy to contact our office. Our fax number is [phone].  If you have an urgent issue when the clinic is closed that cannot wait until the next business day, you can page your doctor at the number below.    Please note that while we do our best to be available for urgent issues outside of office hours, we are not available 24/7.   If you have an urgent issue and are unable to reach us , you may choose to seek medical care at your doctor's office, retail clinic, urgent care center, or emergency room.  If you have a medical emergency, please immediately call 911 or go to the emergency department.  Pager Numbers  - Dr. Bary Likes: [phone]  - Dr. Annette Barters: [phone]  - Dr. Felipe Horton: [phone]   In the event of inclement weather, please call our main line at [phone] for an update on the status of any delays or closures.  Dermatology Medication Tips: Please keep the boxes that topical medications come in in order to help keep track of the instructions about where and how to use these. Pharmacies typically print the medication instructions only on the boxes and not directly on the medication tubes.   If your medication is too expensive, please contact our office at [phone] option 4 or send us  a message through  MyChart.   We are unable to tell what your co-pay for medications will be in advance as this is different depending on your insurance coverage. However, we may be able to find a substitute medication at lower cost or fill out paperwork to get insurance to cover a needed medication.   If a prior authorization is required to get your medication covered by your insurance company, please allow us  1-2 business days to complete this process.  Drug prices often vary depending on where the prescription is filled and some pharmacies may offer cheaper prices.  The website www.goodrx.com contains coupons for medications through different pharmacies. The prices here do not account for what the cost may be with help from insurance (it may be cheaper with your insurance), but the website can give you the price if you did not use any insurance.  - You can print the associated coupon and take it with your prescription to the  pharmacy.  - You may also stop by our office during regular business hours and pick up a GoodRx coupon card.  - If you need your prescription sent electronically to a different pharmacy, notify our office through [facility] or by phone at [phone] option 4.     Si Usted Necesita Algo Despus de Su Visita  Tambin puede enviarnos un mensaje a travs de Clinical cytogeneticist. Por lo general respondemos a los mensajes de MyChart en el transcurso de 1 a 2 das hbiles.  Para renovar recetas, por favor pida a su farmacia que se ponga en contacto con nuestra oficina. Franz Jacks de fax es [place] [phone].  Si tiene un asunto urgente cuando la clnica est cerrada y que no puede esperar hasta el siguiente da hbil, puede llamar/localizar a su doctor(a) al nmero que aparece a continuacin.   Por favor, tenga en cuenta que aunque hacemos todo lo posible para estar disponibles para asuntos urgentes fuera del horario de [place], no estamos disponibles las 24 horas del da, los 7 [address]  [address] de la  [place].   Si tiene un problema urgente y no puede comunicarse con nosotros, puede optar por buscar atencin mdica  en el consultorio de su doctor(a), en una clnica privada, en un centro de atencin urgente o en una sala de emergencias.  Si tiene Engineer, drilling, por favor llame inmediatamente al 911 o vaya a la sala de emergencias.  Nmeros de bper  - Dr. Bary Likes: [phone]  - Dra. Annette Barters: [phone]  - Dr. Felipe Horton: [phone]   En caso de inclemencias del tiempo, por favor llame a Lajuan Pila principal al [phone] para una actualizacin sobre el [place] de cualquier retraso o cierre.  Consejos para la medicacin en dermatologa: Por favor, guarde las cajas en las que vienen los medicamentos de uso tpico para ayudarle a seguir las instrucciones sobre dnde y cmo usarlos. Las farmacias generalmente imprimen las instrucciones del medicamento slo en las cajas y no directamente en los tubos del [place].   Si su medicamento es muy caro, por favor, pngase en contacto con Bettyjane Brunet llamando al [phone] y presione la opcin 4 o envenos un mensaje a travs de Clinical cytogeneticist.   No podemos decirle cul ser su copago por los medicamentos por adelantado ya que esto es diferente dependiendo de la cobertura de su seguro. Sin embargo, es posible que podamos encontrar un medicamento sustituto a Audiological scientist un formulario para que el seguro cubra el medicamento que se considera necesario.   Si se requiere una autorizacin previa para que su compaa de seguros [place] su medicamento, por favor permtanos de 1 a 2 das hbiles para completar este proceso.  Los precios de los medicamentos varan con frecuencia dependiendo del Environmental consultant de dnde se surte la receta y alguna farmacias pueden ofrecer precios ms baratos.  El sitio web www.goodrx.com tiene cupones para medicamentos de Health and safety inspector. Los precios aqu no tienen en cuenta lo que podra costar con la ayuda  del seguro (puede ser ms barato con su seguro), pero el sitio web puede darle el precio si no utiliz Tourist information centre manager.  - Puede imprimir el cupn correspondiente y llevarlo con su receta a la farmacia.  - Tambin puede pasar por nuestra oficina durante el horario de atencin regular y Education officer, museum una tarjeta de cupones de GoodRx.  - Si necesita que su receta se enve electrnicamente a Psychiatrist, informe a nuestra oficina a travs de MyChart de [employer] o por telfono  llamando al [phone] y presione la opcin 4.

## 2024-02-03 LAB — OB RESULTS CONSOLE GBS: GBS: NEGATIVE

## 2024-03-02 ENCOUNTER — Other Ambulatory Visit: Payer: Self-pay | Admitting: Obstetrics & Gynecology

## 2024-03-03 ENCOUNTER — Encounter (HOSPITAL_COMMUNITY): Payer: Self-pay | Admitting: Obstetrics & Gynecology

## 2024-03-03 ENCOUNTER — Inpatient Hospital Stay (HOSPITAL_COMMUNITY)
Admission: RE | Admit: 2024-03-03 | Discharge: 2024-03-05 | DRG: 807 | Disposition: A | Discharge status: Home / Self Care | Attending: Obstetrics & Gynecology | Admitting: Obstetrics & Gynecology

## 2024-03-03 ENCOUNTER — Inpatient Hospital Stay (HOSPITAL_COMMUNITY): Admitting: Anesthesiology

## 2024-03-03 ENCOUNTER — Other Ambulatory Visit: Payer: Self-pay

## 2024-03-03 DIAGNOSIS — Z3490 Encounter for supervision of normal pregnancy, unspecified, unspecified trimester: Principal | ICD-10-CM | POA: Diagnosis present

## 2024-03-03 DIAGNOSIS — O4292 Full-term premature rupture of membranes, unspecified as to length of time between rupture and onset of labor: Principal | ICD-10-CM | POA: Diagnosis present

## 2024-03-03 DIAGNOSIS — Z8249 Family history of ischemic heart disease and other diseases of the circulatory system: Secondary | ICD-10-CM | POA: Diagnosis not present

## 2024-03-03 DIAGNOSIS — Z87891 Personal history of nicotine dependence: Secondary | ICD-10-CM | POA: Diagnosis not present

## 2024-03-03 DIAGNOSIS — Z833 Family history of diabetes mellitus: Secondary | ICD-10-CM

## 2024-03-03 DIAGNOSIS — Z3A40 40 weeks gestation of pregnancy: Secondary | ICD-10-CM

## 2024-03-03 DIAGNOSIS — O26893 Other specified pregnancy related conditions, third trimester: Secondary | ICD-10-CM | POA: Diagnosis present

## 2024-03-03 LAB — CBC
HCT: 37.4 % (ref 36.0–46.0)
Hemoglobin: 12.8 g/dL (ref 12.0–15.0)
MCH: 30 pg (ref 26.0–34.0)
MCHC: 34.2 g/dL (ref 30.0–36.0)
MCV: 87.6 fL (ref 80.0–100.0)
Platelets: 233 K/uL (ref 150–400)
RBC: 4.27 MIL/uL (ref 3.87–5.11)
RDW: 13.9 % (ref 11.5–15.5)
WBC: 10.1 K/uL (ref 4.0–10.5)
nRBC: 0 % (ref 0.0–0.2)

## 2024-03-03 LAB — TYPE AND SCREEN
ABO/RH(D): O POS
Antibody Screen: NEGATIVE

## 2024-03-03 MED ORDER — OXYTOCIN-SODIUM CHLORIDE 30-0.9 UT/500ML-% IV SOLN
1.0000 m[IU]/min | INTRAVENOUS | Status: DC
  Administered 2024-03-04: 2 m[IU]/min via INTRAVENOUS
  Filled 2024-03-03: qty 500

## 2024-03-03 MED ORDER — EPHEDRINE 5 MG/ML INJ: 10.0000 mg | INTRAVENOUS | Status: DC | PRN

## 2024-03-03 MED ORDER — LACTATED RINGERS IV SOLN: INTRAVENOUS | Status: DC

## 2024-03-03 MED ORDER — MISOPROSTOL 25 MCG QUARTER TABLET: 25.0000 ug | ORAL_TABLET | Freq: Once | ORAL | Status: DC

## 2024-03-03 MED ORDER — ACETAMINOPHEN 325 MG PO TABS: 650.0000 mg | ORAL_TABLET | ORAL | Status: DC | PRN

## 2024-03-03 MED ORDER — FENTANYL-BUPIVACAINE-NACL 0.5-0.125-0.9 MG/250ML-% EP SOLN
12.0000 mL/h | EPIDURAL | Status: DC | PRN
  Filled 2024-03-03: qty 250

## 2024-03-03 MED ORDER — LACTATED RINGERS IV SOLN
500.0000 mL | Freq: Once | INTRAVENOUS | Status: DC
Start: 2024-03-03 — End: 2024-03-04

## 2024-03-03 MED ORDER — MISOPROSTOL 50MCG HALF TABLET
50.0000 ug | ORAL_TABLET | Freq: Once | ORAL | Status: AC
  Administered 2024-03-03: 50 ug via ORAL
  Filled 2024-03-03: qty 1

## 2024-03-03 MED ORDER — PHENYLEPHRINE 80 MCG/ML (10ML) SYRINGE FOR IV PUSH (FOR BLOOD PRESSURE SUPPORT): 80.0000 ug | PREFILLED_SYRINGE | INTRAVENOUS | Status: DC | PRN

## 2024-03-03 MED ORDER — LIDOCAINE HCL (PF) 1 % IJ SOLN
INTRAMUSCULAR | Status: DC | PRN
  Administered 2024-03-03: 8 mL via EPIDURAL

## 2024-03-03 MED ORDER — FENTANYL CITRATE (PF) 100 MCG/2ML IJ SOLN
100.0000 ug | INTRAMUSCULAR | Status: DC | PRN
  Administered 2024-03-03: 100 ug via INTRAVENOUS
  Filled 2024-03-03: qty 2

## 2024-03-03 MED ORDER — TERBUTALINE SULFATE 1 MG/ML IJ SOLN: 0.2500 mg | Freq: Once | INTRAMUSCULAR | Status: DC | PRN

## 2024-03-03 MED ORDER — SOD CITRATE-CITRIC ACID 500-334 MG/5ML PO SOLN: 30.0000 mL | ORAL | Status: DC | PRN

## 2024-03-03 MED ORDER — OXYTOCIN BOLUS FROM INFUSION
333.0000 mL | Freq: Once | INTRAVENOUS | Status: AC
  Administered 2024-03-04: 333 mL via INTRAVENOUS

## 2024-03-03 MED ORDER — DIPHENHYDRAMINE HCL 50 MG/ML IJ SOLN: 12.5000 mg | INTRAMUSCULAR | Status: DC | PRN

## 2024-03-03 MED ORDER — LIDOCAINE HCL (PF) 1 % IJ SOLN: 30.0000 mL | INTRAMUSCULAR | Status: DC | PRN

## 2024-03-03 MED ORDER — LACTATED RINGERS IV SOLN: 500.0000 mL | INTRAVENOUS | Status: DC | PRN

## 2024-03-03 MED ORDER — FENTANYL-BUPIVACAINE-NACL 0.5-0.125-0.9 MG/250ML-% EP SOLN
EPIDURAL | Status: DC | PRN
  Administered 2024-03-03: 12 mL/h via EPIDURAL

## 2024-03-03 MED ORDER — ONDANSETRON HCL 4 MG/2ML IJ SOLN
4.0000 mg | Freq: Four times a day (QID) | INTRAMUSCULAR | Status: DC | PRN
  Administered 2024-03-04: 4 mg via INTRAVENOUS
  Filled 2024-03-03: qty 2

## 2024-03-03 MED ORDER — FENTANYL CITRATE (PF) 100 MCG/2ML IJ SOLN
INTRAMUSCULAR | Status: AC
  Administered 2024-03-03: 100 ug
  Filled 2024-03-03: qty 2

## 2024-03-03 MED ORDER — OXYTOCIN-SODIUM CHLORIDE 30-0.9 UT/500ML-% IV SOLN: 2.5000 [IU]/h | INTRAVENOUS | Status: DC

## 2024-03-03 NOTE — Anesthesia Procedure Notes (Signed)
 Epidural Patient location during procedure: OB Start time: 03/03/2024 10:23 PM End time: 03/03/2024 10:34 PM  Staffing Anesthesiologist: Merla Almarie HERO, DO Performed: anesthesiologist   Preanesthetic Checklist Completed: patient identified, IV checked, risks and benefits discussed, monitors and equipment checked, pre-op evaluation and timeout performed  Epidural Patient position: sitting Prep: DuraPrep and site prepped and draped Patient monitoring: continuous pulse ox, blood pressure, heart rate and cardiac monitor Approach: midline Location: L3-L4 Injection technique: LOR air  Needle:  Needle type: Tuohy  Needle gauge: 17 G Needle length: 9 cm Needle insertion depth: 5 cm Catheter type: closed end flexible Catheter size: 19 Gauge Catheter at skin depth: 10 cm Test dose: negative  Assessment Sensory level: T8 Events: blood not aspirated, no cerebrospinal fluid, injection not painful, no injection resistance, no paresthesia and negative IV test  Additional Notes Patient identified. Risks/Benefits/Options discussed with patient including but not limited to bleeding, infection, nerve damage, paralysis, failed block, incomplete pain control, headache, blood pressure changes, nausea, vomiting, reactions to medication both or allergic, itching and postpartum back pain. Confirmed with bedside nurse the patient's most recent platelet count. Confirmed with patient that they are not currently taking any anticoagulation, have any bleeding history or any family history of bleeding disorders. Patient expressed understanding and wished to proceed. All questions were answered. Sterile technique was used throughout the entire procedure. Please see nursing notes for vital signs. Test dose was given through epidural catheter and negative prior to continuing to dose epidural or start infusion. Warning signs of high block given to the patient including shortness of breath, tingling/numbness in  hands, complete motor block, or any concerning symptoms with instructions to call for help. Patient was given instructions on fall risk and not to get out of bed. All questions and concerns addressed with instructions to call with any issues or inadequate analgesia.  Reason for block:procedure for pain

## 2024-03-03 NOTE — H&P (Signed)
 Robin Blankenship is a 33 y.o. female presenting for labor admission following SROM at 12 noon.  GBS neg Pt was offered IOL and scheduled today but she declined this morning, now presenting for SROM.  G1, EDC by 1st trim scwd.  Uncomplicated pregnancy.  Marginal cord w/ AGA growth  Declined NIPT.  Nl PNLabs  Anatomy nl. Boy.  MCI  31 wk sono -  Frank breech, EFW 4'2 57% AC 46% AFI nl anterior placenta. 36 wk sono-  EFW 6 lbs.10 oz., 60%, AC 67% cephalic, AFI normal, placenta appears nl 40.3 wks - EFW 8'13 74% BPP 8/8 AFI 16 cm   BPP 8/8   NST reactive.    OB History     Gravida  1   Para  0   Term  0   Preterm  0   AB  0   Living  0      SAB  0   IAB  0   Ectopic  0   Multiple  0   Live Births  0          Past Medical History:  Diagnosis Date   Allergic reaction to alpha-gal    Asthma    [facility] spotted fever    History reviewed. No pertinent surgical history. Family History: family history includes Cancer in her paternal grandmother; Dementia in her maternal grandmother; Diabetes in her father and paternal grandmother; Heart disease in her father; Melanoma in her paternal grandfather; Stroke in her father and paternal grandfather. Social History:  reports that she has quit smoking. Her smoking use included cigarettes. She quit smokeless tobacco use about 12 years ago. She reports current alcohol use. She reports that she does not use drugs.     Maternal Diabetes: No Genetic Screening: Declined Maternal Ultrasounds/Referrals: Normal Fetal Ultrasounds or other Referrals:  None Maternal Substance Abuse:  No Significant Maternal Medications:  None Significant Maternal Lab Results:  Group B Strep negative Number of Prenatal Visits:greater than 3 verified prenatal visits Maternal Vaccinations:RSV: Given during pregnancy >/=14 days ago and TDap  RSV pn 01/26/24  Other Comments:  None   Exam Physical Exam  BP 105/67   Pulse 83   Temp 98.6 F (37 C)  (Oral)   Resp 18   Ht 5' 4 (1.626 m)   Wt 77.7 kg   LMP 05/24/2023   BMI 29.42 kg/m   Physical exam:  A&O x 3, no acute distress. Pleasant HEENT neg, no thyromegaly Lungs CTA bilat CV RRR, S1S2 normal Abdo soft, non tender, non acute Extr no edema/ tenderness Pelvic  FHT  120s + accels no decels mod variab- cat I Toco none   Prenatal labs: ABO, Rh:  O+ Antibody:  Neg  Rubella: Immune (04/02 0000) VZ immune RPR: Nonreactive (04/02 0000)  HBsAg: Negative (04/02 0000)  Hep C neg  HIV: Non-reactive (04/02 0000)  GBS: Negative/-- (10/03 0000)  1hr failed, 3hr GTT nl  AFP1 neg   Assessment/Plan: 33 yo G1 at 40.4 wks, labor admit for SROM at 12 noon today.  FHT cat I EFW 8.1/2 lbs, working towards SVD  Cytotec 50 mcg PO  Limit vaginal exams  Pain meds as desired. Ambulate until epidural     Robin Blankenship 03/03/2024, 4:04 PM

## 2024-03-03 NOTE — Progress Notes (Signed)
 Patient ID: Robin Blankenship, female   DOB: 12/16/1990, 33 y.o.   MRN: [id number] Cramps are stronger. Some bloody mucus per pt   BP 127/68   Pulse 77   Temp 98.6 F (37 C) (Oral)   Resp 16   Ht 5' 4 (1.626 m)   Wt 77.7 kg   LMP 05/24/2023   BMI 29.42 kg/m  FHT cat I  Toco q 4-5 min since 1st dose of Cytotec at admission  SVE 3/90%/-2, small caput. Head well applied, clear fluid  GBS neg  Fentanyl for pain mngmt  Add pitocin if UCs space out, RN advised  Epidural per pt  Working towards SVD

## 2024-03-03 NOTE — Anesthesia Preprocedure Evaluation (Signed)
 Anesthesia Evaluation  Patient identified by MRN, date of birth, ID band Patient awake    Reviewed: Allergy  & Precautions, Patient's Chart, lab work & pertinent test results  Airway Mallampati: II  TM Distance: >3 FB Neck ROM: Full    Dental no notable dental hx.    Pulmonary asthma , former smoker   Pulmonary exam normal breath sounds clear to auscultation       Cardiovascular negative cardio ROS Normal cardiovascular exam Rhythm:Regular Rate:Normal     Neuro/Psych negative neurological ROS  negative psych ROS   GI/Hepatic negative GI ROS, Neg liver ROS,,,  Endo/Other  negative endocrine ROS    Renal/GU negative Renal ROS  negative genitourinary   Musculoskeletal negative musculoskeletal ROS (+)    Abdominal   Peds negative pediatric ROS (+)  Hematology negative hematology ROS (+)   Anesthesia Other Findings Alpha gal  Reproductive/Obstetrics (+) Pregnancy                              Anesthesia Physical Anesthesia Plan  ASA: 2  Anesthesia Plan: Epidural   Post-op Pain Management:    Induction:   PONV Risk Score and Plan: 2  Airway Management Planned: Natural Airway  Additional Equipment: None  Intra-op Plan:   Post-operative Plan:   Informed Consent: I have reviewed the patients History and Physical, chart, labs and discussed the procedure including the risks, benefits and alternatives for the proposed anesthesia with the patient or authorized representative who has indicated his/her understanding and acceptance.       Plan Discussed with:   Anesthesia Plan Comments:         Anesthesia Quick Evaluation

## 2024-03-04 ENCOUNTER — Encounter (HOSPITAL_COMMUNITY): Payer: Self-pay | Admitting: Obstetrics & Gynecology

## 2024-03-04 LAB — RPR: RPR Ser Ql: NONREACTIVE

## 2024-03-04 MED ORDER — TETANUS-DIPHTH-ACELL PERTUSSIS 5-2-15.5 LF-MCG/0.5 IM SUSP
0.5000 mL | Freq: Once | INTRAMUSCULAR | Status: DC
  Filled 2024-03-04: qty 0.5

## 2024-03-04 MED ORDER — ZOLPIDEM TARTRATE 5 MG PO TABS: 5.0000 mg | ORAL_TABLET | Freq: Every evening | ORAL | Status: DC | PRN

## 2024-03-04 MED ORDER — IBUPROFEN 600 MG PO TABS
600.0000 mg | ORAL_TABLET | Freq: Four times a day (QID) | ORAL | Status: DC
  Administered 2024-03-04 – 2024-03-05 (×4): 600 mg via ORAL
  Filled 2024-03-04 (×4): qty 1

## 2024-03-04 MED ORDER — COCONUT OIL OIL: 1.0000 | TOPICAL_OIL | Status: DC | PRN

## 2024-03-04 MED ORDER — DIPHENHYDRAMINE HCL 25 MG PO CAPS: 25.0000 mg | ORAL_CAPSULE | Freq: Four times a day (QID) | ORAL | Status: DC | PRN

## 2024-03-04 MED ORDER — DIBUCAINE (PERIANAL) 1 % EX OINT: 1.0000 | TOPICAL_OINTMENT | CUTANEOUS | Status: DC | PRN

## 2024-03-04 MED ORDER — SENNOSIDES-DOCUSATE SODIUM 8.6-50 MG PO TABS
2.0000 | ORAL_TABLET | Freq: Every day | ORAL | Status: DC
  Administered 2024-03-05: 2 via ORAL
  Filled 2024-03-04: qty 2

## 2024-03-04 MED ORDER — WITCH HAZEL-GLYCERIN EX PADS: 1.0000 | MEDICATED_PAD | CUTANEOUS | Status: DC | PRN

## 2024-03-04 MED ORDER — SIMETHICONE 80 MG PO CHEW: 80.0000 mg | CHEWABLE_TABLET | ORAL | Status: DC | PRN

## 2024-03-04 MED ORDER — BENZOCAINE-MENTHOL 20-0.5 % EX AERO
1.0000 | INHALATION_SPRAY | CUTANEOUS | Status: DC | PRN
  Administered 2024-03-04: 1 via TOPICAL
  Filled 2024-03-04: qty 56

## 2024-03-04 MED ORDER — PRENATAL MULTIVITAMIN CH
1.0000 | ORAL_TABLET | Freq: Every day | ORAL | Status: DC
  Administered 2024-03-04 – 2024-03-05 (×2): 1 via ORAL
  Filled 2024-03-04 (×2): qty 1

## 2024-03-04 MED ORDER — ONDANSETRON HCL 4 MG/2ML IJ SOLN: 4.0000 mg | INTRAMUSCULAR | Status: DC | PRN

## 2024-03-04 MED ORDER — ONDANSETRON HCL 4 MG PO TABS
4.0000 mg | ORAL_TABLET | ORAL | Status: DC | PRN
Start: 2024-03-04 — End: 2024-03-05

## 2024-03-04 MED ORDER — ACETAMINOPHEN 325 MG PO TABS: 650.0000 mg | ORAL_TABLET | ORAL | Status: DC | PRN

## 2024-03-05 LAB — CBC
HCT: 33.2 % — ABNORMAL LOW (ref 36.0–46.0)
Hemoglobin: 11.3 g/dL — ABNORMAL LOW (ref 12.0–15.0)
MCH: 30.7 pg (ref 26.0–34.0)
MCHC: 34 g/dL (ref 30.0–36.0)
MCV: 90.2 fL (ref 80.0–100.0)
Platelets: 203 K/uL (ref 150–400)
RBC: 3.68 MIL/uL — ABNORMAL LOW (ref 3.87–5.11)
RDW: 14.2 % (ref 11.5–15.5)
WBC: 16.2 K/uL — ABNORMAL HIGH (ref 4.0–10.5)
nRBC: 0 % (ref 0.0–0.2)

## 2024-03-05 MED ORDER — ACETAMINOPHEN 500 MG PO TABS: 1000.0000 mg | ORAL_TABLET | Freq: Four times a day (QID) | ORAL | 1 refills | Status: AC | PRN

## 2024-03-05 MED ORDER — IBUPROFEN 600 MG PO TABS: 600.0000 mg | ORAL_TABLET | Freq: Four times a day (QID) | ORAL | 0 refills | Status: AC

## 2024-03-05 MED ORDER — ALBUTEROL SULFATE (2.5 MG/3ML) 0.083% IN NEBU: 2.5000 mg | INHALATION_SOLUTION | RESPIRATORY_TRACT | Status: DC | PRN

## 2024-03-05 NOTE — Plan of Care (Signed)
 Problem: Education: Goal: Knowledge of General Education information will improve Description: Including pain rating scale, medication(s)/side effects and non-pharmacologic comfort measures 03/05/2024 0931 by Madison Rosina LABOR, LPN Outcome: Adequate for Discharge 03/05/2024 9287 by Madison Rosina LABOR, LPN Outcome: Progressing   Problem: Health Behavior/Discharge Planning: Goal: Ability to manage health-related needs will improve 03/05/2024 0931 by Madison Rosina LABOR, LPN Outcome: Adequate for Discharge 03/05/2024 9287 by Madison Rosina LABOR, LPN Outcome: Progressing   Problem: Clinical Measurements: Goal: Ability to maintain clinical measurements within normal limits will improve 03/05/2024 0931 by Madison Rosina LABOR, LPN Outcome: Adequate for Discharge 03/05/2024 9287 by Madison Rosina LABOR, LPN Outcome: Progressing Goal: Will remain free from infection 03/05/2024 0931 by Madison Rosina LABOR, LPN Outcome: Adequate for Discharge 03/05/2024 9287 by Madison Rosina LABOR, LPN Outcome: Progressing Goal: Diagnostic test results will improve 03/05/2024 0931 by Madison Rosina LABOR, LPN Outcome: Adequate for Discharge 03/05/2024 9287 by Madison Rosina LABOR, LPN Outcome: Progressing Goal: Respiratory complications will improve 03/05/2024 0931 by Madison Rosina LABOR, LPN Outcome: Adequate for Discharge 03/05/2024 9287 by Madison Rosina LABOR, LPN Outcome: Progressing Goal: Cardiovascular complication will be avoided 03/05/2024 0931 by Madison Rosina LABOR, LPN Outcome: Adequate for Discharge 03/05/2024 9287 by Madison Rosina LABOR, LPN Outcome: Progressing   Problem: Activity: Goal: Risk for activity intolerance will decrease 03/05/2024 0931 by Madison Rosina LABOR, LPN Outcome: Adequate for Discharge 03/05/2024 9287 by Madison Rosina LABOR, LPN Outcome: Progressing   Problem: Nutrition: Goal: Adequate nutrition will be maintained 03/05/2024 0931 by Madison Rosina LABOR, LPN Outcome: Adequate for Discharge 03/05/2024 0712 by Madison Rosina LABOR, LPN Outcome: Progressing    Problem: Coping: Goal: Level of anxiety will decrease 03/05/2024 0931 by Madison Rosina LABOR, LPN Outcome: Adequate for Discharge 03/05/2024 0712 by Madison Rosina LABOR, LPN Outcome: Progressing   Problem: Elimination: Goal: Will not experience complications related to bowel motility 03/05/2024 0931 by Madison Rosina LABOR, LPN Outcome: Adequate for Discharge 03/05/2024 0712 by Madison Rosina LABOR, LPN Outcome: Progressing Goal: Will not experience complications related to urinary retention 03/05/2024 0931 by Madison Rosina LABOR, LPN Outcome: Adequate for Discharge 03/05/2024 9287 by Madison Rosina LABOR, LPN Outcome: Progressing   Problem: Pain Managment: Goal: General experience of comfort will improve and/or be controlled 03/05/2024 0931 by Madison Rosina LABOR, LPN Outcome: Adequate for Discharge 03/05/2024 9287 by Madison Rosina LABOR, LPN Outcome: Progressing   Problem: Safety: Goal: Ability to remain free from injury will improve 03/05/2024 0931 by Madison Rosina LABOR, LPN Outcome: Adequate for Discharge 03/05/2024 9287 by Madison Rosina LABOR, LPN Outcome: Progressing   Problem: Skin Integrity: Goal: Risk for impaired skin integrity will decrease 03/05/2024 0931 by Madison Rosina LABOR, LPN Outcome: Adequate for Discharge 03/05/2024 9287 by Madison Rosina LABOR, LPN Outcome: Progressing   Problem: Education: Goal: Knowledge of Childbirth will improve 03/05/2024 0931 by Madison Rosina LABOR, LPN Outcome: Adequate for Discharge 03/05/2024 9287 by Madison Rosina LABOR, LPN Outcome: Progressing Goal: Ability to make informed decisions regarding treatment and plan of care will improve 03/05/2024 0931 by Madison Rosina LABOR, LPN Outcome: Adequate for Discharge 03/05/2024 9287 by Madison Rosina LABOR, LPN Outcome: Progressing Goal: Ability to state and carry out methods to decrease the pain will improve 03/05/2024 0931 by Madison Rosina LABOR, LPN Outcome: Adequate for Discharge 03/05/2024 9287 by Madison Rosina LABOR, LPN Outcome: Progressing Goal: Individualized Educational  Video(s) 03/05/2024 0931 by Madison Rosina LABOR, LPN Outcome: Adequate for Discharge 03/05/2024 9287 by Madison Rosina LABOR, LPN Outcome: Progressing   Problem: Coping: Goal: Ability to verbalize  concerns and feelings about labor and delivery will improve 03/05/2024 0931 by Madison Rosina LABOR, LPN Outcome: Adequate for Discharge 03/05/2024 9287 by Madison Rosina LABOR, LPN Outcome: Progressing   Problem: Life Cycle: Goal: Ability to make normal progression through stages of labor will improve 03/05/2024 0931 by Madison Rosina LABOR, LPN Outcome: Adequate for Discharge 03/05/2024 9287 by Madison Rosina LABOR, LPN Outcome: Progressing Goal: Ability to effectively push during vaginal delivery will improve 03/05/2024 0931 by Madison Rosina LABOR, LPN Outcome: Adequate for Discharge 03/05/2024 9287 by Madison Rosina LABOR, LPN Outcome: Progressing   Problem: Role Relationship: Goal: Will demonstrate positive interactions with the child 03/05/2024 0931 by Madison Rosina LABOR, LPN Outcome: Adequate for Discharge 03/05/2024 9287 by Madison Rosina LABOR, LPN Outcome: Progressing   Problem: Safety: Goal: Risk of complications during labor and delivery will decrease 03/05/2024 0931 by Madison Rosina LABOR, LPN Outcome: Adequate for Discharge 03/05/2024 9287 by Madison Rosina LABOR, LPN Outcome: Progressing   Problem: Pain Management: Goal: Relief or control of pain from uterine contractions will improve 03/05/2024 0931 by Madison Rosina LABOR, LPN Outcome: Adequate for Discharge 03/05/2024 9287 by Madison Rosina LABOR, LPN Outcome: Progressing   Problem: Education: Goal: Knowledge of condition will improve 03/05/2024 0931 by Madison Rosina LABOR, LPN Outcome: Adequate for Discharge 03/05/2024 9287 by Madison Rosina LABOR, LPN Outcome: Progressing Goal: Individualized Educational Video(s) 03/05/2024 0931 by Madison Rosina LABOR, LPN Outcome: Adequate for Discharge 03/05/2024 9287 by Madison Rosina LABOR, LPN Outcome: Progressing Goal: Individualized Newborn Educational Video(s) 03/05/2024  0931 by Madison Rosina LABOR, LPN Outcome: Adequate for Discharge 03/05/2024 9287 by Madison Rosina LABOR, LPN Outcome: Progressing   Problem: Activity: Goal: Will verbalize the importance of balancing activity with adequate rest periods 03/05/2024 0931 by Madison Rosina LABOR, LPN Outcome: Adequate for Discharge 03/05/2024 9287 by Madison Rosina LABOR, LPN Outcome: Progressing Goal: Ability to tolerate increased activity will improve 03/05/2024 0931 by Madison Rosina LABOR, LPN Outcome: Adequate for Discharge 03/05/2024 9287 by Madison Rosina LABOR, LPN Outcome: Progressing   Problem: Coping: Goal: Ability to identify and utilize available resources and services will improve 03/05/2024 0931 by Madison Rosina LABOR, LPN Outcome: Adequate for Discharge 03/05/2024 9287 by Madison Rosina LABOR, LPN Outcome: Progressing   Problem: Life Cycle: Goal: Chance of risk for complications during the postpartum period will decrease 03/05/2024 0931 by Madison Rosina LABOR, LPN Outcome: Adequate for Discharge 03/05/2024 9287 by Madison Rosina LABOR, LPN Outcome: Progressing   Problem: Role Relationship: Goal: Ability to demonstrate positive interaction with newborn will improve 03/05/2024 0931 by Madison Rosina LABOR, LPN Outcome: Adequate for Discharge 03/05/2024 9287 by Madison Rosina LABOR, LPN Outcome: Progressing   Problem: Skin Integrity: Goal: Demonstration of wound healing without infection will improve 03/05/2024 0931 by Madison Rosina LABOR, LPN Outcome: Adequate for Discharge 03/05/2024 9287 by Madison Rosina LABOR, LPN Outcome: Progressing   Problem: Education: Goal: Knowledge of condition will improve 03/05/2024 0931 by Madison Rosina LABOR, LPN Outcome: Adequate for Discharge 03/05/2024 9287 by Madison Rosina LABOR, LPN Outcome: Progressing Goal: Individualized Educational Video(s) 03/05/2024 0931 by Madison Rosina LABOR, LPN Outcome: Adequate for Discharge 03/05/2024 9287 by Madison Rosina LABOR, LPN Outcome: Progressing Goal: Individualized Newborn Educational Video(s) 03/05/2024  0931 by Madison Rosina LABOR, LPN Outcome: Adequate for Discharge 03/05/2024 9287 by Madison Rosina LABOR, LPN Outcome: Progressing   Problem: Activity: Goal: Will verbalize the importance of balancing activity with adequate rest periods 03/05/2024 0931 by Madison Rosina LABOR, LPN Outcome: Adequate for Discharge 03/05/2024 9287 by Madison Rosina LABOR, LPN Outcome:  Progressing Goal: Ability to tolerate increased activity will improve 03/05/2024 0931 by Madison Rosina LABOR, LPN Outcome: Adequate for Discharge 03/05/2024 9287 by Madison Rosina LABOR, LPN Outcome: Progressing   Problem: Coping: Goal: Ability to identify and utilize available resources and services will improve 03/05/2024 0931 by Madison Rosina LABOR, LPN Outcome: Adequate for Discharge 03/05/2024 9287 by Madison Rosina LABOR, LPN Outcome: Progressing   Problem: Life Cycle: Goal: Chance of risk for complications during the postpartum period will decrease 03/05/2024 0931 by Madison Rosina LABOR, LPN Outcome: Adequate for Discharge 03/05/2024 9287 by Madison Rosina LABOR, LPN Outcome: Progressing   Problem: Role Relationship: Goal: Ability to demonstrate positive interaction with newborn will improve 03/05/2024 0931 by Madison Rosina LABOR, LPN Outcome: Adequate for Discharge 03/05/2024 9287 by Madison Rosina LABOR, LPN Outcome: Progressing   Problem: Skin Integrity: Goal: Demonstration of wound healing without infection will improve 03/05/2024 0931 by Madison Rosina LABOR, LPN Outcome: Adequate for Discharge 03/05/2024 9287 by Madison Rosina LABOR, LPN Outcome: Progressing

## 2024-03-05 NOTE — Plan of Care (Signed)
  Problem: Education: Goal: Knowledge of General Education information will improve Description: Including pain rating scale, medication(s)/side effects and non-pharmacologic comfort measures Outcome: Progressing   Problem: Health Behavior/Discharge Planning: Goal: Ability to manage health-related needs will improve Outcome: Progressing   Problem: Clinical Measurements: Goal: Ability to maintain clinical measurements within normal limits will improve Outcome: Progressing Goal: Will remain free from infection Outcome: Progressing Goal: Diagnostic test results will improve Outcome: Progressing Goal: Respiratory complications will improve Outcome: Progressing Goal: Cardiovascular complication will be avoided Outcome: Progressing   Problem: Activity: Goal: Risk for activity intolerance will decrease Outcome: Progressing   Problem: Nutrition: Goal: Adequate nutrition will be maintained Outcome: Progressing   Problem: Coping: Goal: Level of anxiety will decrease Outcome: Progressing   Problem: Elimination: Goal: Will not experience complications related to bowel motility Outcome: Progressing Goal: Will not experience complications related to urinary retention Outcome: Progressing   Problem: Pain Managment: Goal: General experience of comfort will improve and/or be controlled Outcome: Progressing   Problem: Safety: Goal: Ability to remain free from injury will improve Outcome: Progressing   Problem: Skin Integrity: Goal: Risk for impaired skin integrity will decrease Outcome: Progressing   Problem: Education: Goal: Knowledge of Childbirth will improve Outcome: Progressing Goal: Ability to make informed decisions regarding treatment and plan of care will improve Outcome: Progressing Goal: Ability to state and carry out methods to decrease the pain will improve Outcome: Progressing Goal: Individualized Educational Video(s) Outcome: Progressing   Problem:  Coping: Goal: Ability to verbalize concerns and feelings about labor and delivery will improve Outcome: Progressing   Problem: Life Cycle: Goal: Ability to make normal progression through stages of labor will improve Outcome: Progressing Goal: Ability to effectively push during vaginal delivery will improve Outcome: Progressing   Problem: Role Relationship: Goal: Will demonstrate positive interactions with the child Outcome: Progressing   Problem: Safety: Goal: Risk of complications during labor and delivery will decrease Outcome: Progressing   Problem: Pain Management: Goal: Relief or control of pain from uterine contractions will improve Outcome: Progressing   Problem: Education: Goal: Knowledge of condition will improve Outcome: Progressing Goal: Individualized Educational Video(s) Outcome: Progressing Goal: Individualized Newborn Educational Video(s) Outcome: Progressing   Problem: Activity: Goal: Will verbalize the importance of balancing activity with adequate rest periods Outcome: Progressing Goal: Ability to tolerate increased activity will improve Outcome: Progressing   Problem: Coping: Goal: Ability to identify and utilize available resources and services will improve Outcome: Progressing   Problem: Life Cycle: Goal: Chance of risk for complications during the postpartum period will decrease Outcome: Progressing   Problem: Role Relationship: Goal: Ability to demonstrate positive interaction with newborn will improve Outcome: Progressing   Problem: Skin Integrity: Goal: Demonstration of wound healing without infection will improve Outcome: Progressing   Problem: Education: Goal: Knowledge of condition will improve Outcome: Progressing Goal: Individualized Educational Video(s) Outcome: Progressing Goal: Individualized Newborn Educational Video(s) Outcome: Progressing   Problem: Activity: Goal: Will verbalize the importance of balancing activity  with adequate rest periods Outcome: Progressing Goal: Ability to tolerate increased activity will improve Outcome: Progressing   Problem: Coping: Goal: Ability to identify and utilize available resources and services will improve Outcome: Progressing   Problem: Life Cycle: Goal: Chance of risk for complications during the postpartum period will decrease Outcome: Progressing   Problem: Role Relationship: Goal: Ability to demonstrate positive interaction with newborn will improve Outcome: Progressing   Problem: Skin Integrity: Goal: Demonstration of wound healing without infection will improve Outcome: Progressing

## 2024-03-05 NOTE — Lactation Note (Signed)
 This note was copied from a baby's chart. Lactation Consultation Note  Patient Name: Robin Blankenship Unijb'd Date: 03/05/2024 Age:32 hours Reason for consult: Initial assessment;Term;Primapara  P1. Mom latched baby w/o difficulty.  Mom hand expressed colostrum easily but continued to compress breast and that made nipple draw inward. LC suggested after hand expressing colostrum relax breast so nipple everts back out well. Mom did so and baby latched well. Explained the baby wants to feel the nipple and if compressed tight he can't and will get frustrated. Baby BF so good. Newborn feeding habits, behavior, STS, I&O, body alignment, positions, support, props reviewed. Mom encouraged to feed baby 8-12 times/24 hours and with feeding cues.  Shells given to assist w/areola edema. Encouraged mom to call for assistance as needed. Maternal Data Does the patient have breastfeeding experience prior to this delivery?: No  Feeding    LATCH Score Latch: Grasps breast easily, tongue down, lips flanged, rhythmical sucking.  Audible Swallowing: Spontaneous and intermittent  Type of Nipple: Everted at rest and after stimulation (short shaft/edema)  Comfort (Breast/Nipple): Filling, red/small blisters or bruises, mild/mod discomfort (edema)  Hold (Positioning): No assistance needed to correctly position infant at breast.  LATCH Score: 9   Lactation Tools Discussed/Used Tools: Shells;Pump Breast pump type: Manual  Interventions Interventions: Breast feeding basics reviewed;Hand express;Breast compression;Adjust position;Support pillows;Position options;Expressed milk;Shells;Education;LC Services brochure  Discharge Discharge Education: Outpatient recommendation Pump: DEBP (spectra )  Consult Status Consult Status: Follow-up Date: 03/06/24 Follow-up type: In-patient    Solomon Skowronek G 03/05/2024, 3:32 AM

## 2024-03-05 NOTE — Lactation Note (Signed)
 This note was copied from a baby's chart. Lactation Consultation Note  Patient Name: Robin Blankenship Unijb'd Date: 03/05/2024 Age:32 hours, P1  Reason for consult: Follow-up assessment;Primapara;1st time breastfeeding;Term;Infant weight loss;Nipple pain/trauma Baby latched with depth when LC entered the room and LC noted multiple swallows.  Per mom having some nipple sore ness, and has all the breast feeding tools that help.  LC recommended the steps of latching prior to latching to decrease the sensitivity.  LC reviewed breast feeding D/C teaching and stressed the importance of engorgement prevention and tx.  Mom aware the [facility] resources if needed.   Maternal Data Has patient been taught Hand Expression?: Yes Does the patient have breastfeeding experience prior to this delivery?: No  Feeding Mother's Current Feeding Choice: Breast Milk  LATCH Score Latch:  (latched on the left breast with depth and swallows)  Audible Swallowing:  (multiple swallows  noted)  Type of Nipple:  (nipple well rounded when baby released)  Comfort (Breast/Nipple):  (per mom comfortable at the latch, having some nipple tenerness. Have shells, hand pump, coconut oil, and her sliverettes)  Hold (Positioning):  (mom latched  the baby)  LATCH Score: 9   Lactation Tools Discussed/Used Tools: Shells;Pump;Flanges Flange Size: 18;21 Breast pump type: Manual Pump Education: Setup, frequency, and cleaning;Milk Storage Reason for Pumping: PRN  Interventions Interventions: Breast feeding basics reviewed;Reverse pressure;Breast compression;Coconut oil;Shells;Hand pump;Education;LC Services brochure;CDC milk storage guidelines;CDC Guidelines for Breast Pump Cleaning  Discharge Discharge Education: Engorgement and breast care;Warning signs for feeding baby;Outpatient recommendation;Other (comment) (if needed) Pump: Manual;DEBP;Personal WIC Program: No  Consult Status Consult Status: Complete Date:  03/05/24    Rollene Jenkins Fiedler 03/05/2024, 11:20 AM

## 2024-03-05 NOTE — Discharge Instructions (Signed)

## 2024-03-05 NOTE — Discharge Summary (Signed)
 OB Discharge Summary  Patient Name: Robin Blankenship DOB: 1991-02-03 MRN: [id number]  Date of admission: 03/03/2024 Delivering MD: MODY, VAISHALI  Date of discharge: 03/05/2024  Admitting diagnosis: Encounter for induction of labor [Z34.90] SVD (spontaneous vaginal delivery) [O80] Intrauterine pregnancy: [redacted]w[redacted]d     Secondary diagnosis:Principal Problem:   Encounter for induction of labor Active Problems:   SVD (spontaneous vaginal delivery)  Additional problems: None     Discharge diagnosis: Term Pregnancy Delivered                                                                     Post partum procedures:None  Augmentation: Cytotec  Complications: None  Hospital course:  Induction of Labor With Vaginal Delivery   33 y.o. yo G1P1001 at [redacted]w[redacted]d was admitted to the hospital 03/03/2024 for induction of labor.  Indication for induction: PROM.  Patient had an labor course complicated by nothing Membrane Rupture Time/Date: 12:00 PM,03/03/2024  Delivery Method:Vaginal, Spontaneous Operative Delivery:N/A Episiotomy: None Lacerations:  Perineal;Vaginal;1st degree Details of delivery can be found in separate delivery note.  Patient had a postpartum course complicated by nothing.  On day of discharge patient was ambulating without dizziness, voiding without complication, tolerating a regular p.o.  Patient felt like she had good success with breast-feeding and was appreciative of nursing assistance.  Patient felt ready for early discharge.  Patient is discharged home 03/05/24.  Newborn Data: Birth date:03/04/2024 Birth time:8:09 AM Gender:Female Living status:Living Apgars:7 ,9  Weight:3800 g  Physical exam  Vitals:   03/04/24 1521 03/04/24 1937 03/04/24 2317 03/05/24 0508  BP: 121/80 104/81 122/74 116/78  Pulse: 71 83 77 94  Resp: 17 16 16 16   Temp: 98.8 F (37.1 C) 98.6 F (37 C) 97.9 F (36.6 C) 98 F (36.7 C)  TempSrc: Oral Oral Oral Oral  SpO2: 100% 99% 99% 98%  Weight:       Height:       General: alert, cooperative, and no distress Lochia: appropriate Uterine Fundus: firm Incision: N/A DVT Evaluation: No evidence of DVT seen on physical exam. Labs: Lab Results  Component Value Date   WBC 16.2 (H) 03/05/2024   HGB 11.3 (L) 03/05/2024   HCT 33.2 (L) 03/05/2024   MCV 90.2 03/05/2024   PLT 203 03/05/2024      Latest Ref Rng & Units 07/26/2012    2:11 PM  CMP  Glucose 70 - 99 mg/dL 86   BUN 6 - 23 mg/dL 12   Creatinine 0.4 - 1.2 mg/dL 0.8   Sodium 864 - 854 mEq/L 140   Potassium 3.5 - 5.1 mEq/L 4.1   Chloride 96 - 112 mEq/L 105   CO2 19 - 32 mEq/L 24   Calcium 8.4 - 10.5 mg/dL 9.9   Total Protein 6.0 - 8.3 g/dL 7.4   Total Bilirubin 0.3 - 1.2 mg/dL 1.0   Alkaline Phos 39 - 117 U/L 59   AST 0 - 37 U/L 27   ALT 0 - 35 U/L 15     Discharge instruction: per After Visit Summary and Baby and Me Booklet.  After Visit Meds:  Allergies as of 03/05/2024       Reactions   Alpha-gal Other (See Comments)  Medication List     STOP taking these medications    albuterol  108 (90 Base) MCG/ACT inhaler Commonly known as: Ventolin  HFA   Azelaic Acid  15 % gel   prenatal multivitamin Tabs tablet       TAKE these medications    acetaminophen  500 MG tablet Commonly known as: TYLENOL  Take 2 tablets (1,000 mg total) by mouth every 6 (six) hours as needed (for pain scale < 4).   ibuprofen 600 MG tablet Commonly known as: ADVIL Take 1 tablet (600 mg total) by mouth every 6 (six) hours.        Diet: routine diet  Activity: Advance as tolerated. Pelvic rest for 6 weeks.   Outpatient follow up:6 weeks Follow up Appt: Future Appointments  Date Time Provider Department Center  05/22/2024 10:30 AM Jackquline Sawyer, MD [facility] None   Follow up visit: No follow-ups on file.  Postpartum contraception: Not Discussed  Newborn Data: Live born female  Birth Weight: 8 lb 6 oz (3800 g) APGAR: 7, 9  Newborn Delivery   Birth date/time:  03/04/2024 08:09:00 Delivery type: Vaginal, Spontaneous     Baby Feeding: Breast Disposition:home with mother   03/05/2024 Burnard DELENA Bowers, MD

## 2024-03-05 NOTE — Anesthesia Postprocedure Evaluation (Signed)
 Anesthesia Post Note  Patient: Robin Blankenship  Procedure(s) Performed: AN AD HOC LABOR EPIDURAL     Patient location during evaluation: Mother Baby Anesthesia Type: Epidural Level of consciousness: awake Pain management: satisfactory to patient Vital Signs Assessment: post-procedure vital signs reviewed and stable Respiratory status: spontaneous breathing Cardiovascular status: stable Anesthetic complications: no   No notable events documented.  Last Vitals:  Vitals:   03/04/24 2317 03/05/24 0508  BP: 122/74 116/78  Pulse: 77 94  Resp: 16 16  Temp: 36.6 C 36.7 C  SpO2: 99% 98%    Last Pain:  Vitals:   03/05/24 0900  TempSrc:   PainSc: 0-No pain   Pain Goal: Patients Stated Pain Goal: 0 (03/04/24 1137)                 JEANENNE HANDING

## 2024-03-20 ENCOUNTER — Telehealth (HOSPITAL_COMMUNITY): Payer: Self-pay | Admitting: *Deleted

## 2024-03-20 NOTE — Telephone Encounter (Signed)
 Attempted hospital discharge follow-up call. Left message for patient to return RN call with any questions or concerns. Allean IVAR Carton, RN, 03/20/24, [phone]

## 2024-05-22 ENCOUNTER — Ambulatory Visit: Admitting: Dermatology

## 2024-05-24 ENCOUNTER — Ambulatory Visit: Admitting: Dermatology

## 2024-05-24 DIAGNOSIS — D229 Melanocytic nevi, unspecified: Secondary | ICD-10-CM

## 2024-05-24 DIAGNOSIS — D224 Melanocytic nevi of scalp and neck: Secondary | ICD-10-CM

## 2024-05-24 DIAGNOSIS — D1801 Hemangioma of skin and subcutaneous tissue: Secondary | ICD-10-CM

## 2024-05-24 DIAGNOSIS — D280 Benign neoplasm of vulva: Secondary | ICD-10-CM

## 2024-05-24 DIAGNOSIS — D2262 Melanocytic nevi of left upper limb, including shoulder: Secondary | ICD-10-CM | POA: Diagnosis not present

## 2024-05-24 DIAGNOSIS — L814 Other melanin hyperpigmentation: Secondary | ICD-10-CM

## 2024-05-24 DIAGNOSIS — Z808 Family history of malignant neoplasm of other organs or systems: Secondary | ICD-10-CM

## 2024-05-24 DIAGNOSIS — D225 Melanocytic nevi of trunk: Secondary | ICD-10-CM | POA: Diagnosis not present

## 2024-05-24 DIAGNOSIS — D485 Neoplasm of uncertain behavior of skin: Secondary | ICD-10-CM

## 2024-05-24 NOTE — Patient Instructions (Addendum)

## 2024-05-24 NOTE — Progress Notes (Signed)
 "  Follow-Up Visit   Subjective  Robin Blankenship is a 34 y.o. female who presents for the following: Mole check.  The patient has spots, moles and lesions to be evaluated, some may be new or changing. Some moles are sensitive.  This patient is accompanied in the office by her son (94 months old).   The following portions of the chart were reviewed this encounter and updated as appropriate: medications, allergies, medical history  Review of Systems:  No other skin or systemic complaints except as noted in HPI or Assessment and Plan.  Objective  Well appearing patient in no apparent distress; mood and affect are within normal limits.  A full examination was performed including face, trunk, and extremities. All findings within normal limits unless otherwise noted below.   Relevant physical exam findings are noted in the Assessment and Plan.  R spinal mid lower back 5 mm fleshy tan papule   R spinal mid lower back and L spinal lower back  L spinal lower back 5.0 mm fleshy tan papule L lateral neck 6 mm pink brown papule   Left posterior neck and Left lateral neck  L posterior neck 4.0 mm tan papule    Assessment & Plan   LENTIGINES, HEMANGIOMAS - Benign normal skin lesions - Benign-appearing - Call for any changes  MELANOCYTIC NEVI - Tan-brown and/or pink-flesh-colored symmetric macules and papules - L post shoulder 2.5 mm medium dark brown macule  - L sacrum 2 mm medium dark brown macule - L lat chest 5 x 3 mm light brown macule with dark brown 1 mm center, stable photo  - Central upper abdomen 3 mm tan papule, lighter rim - Benign appearing on exam today - Observation - Call clinic for new or changing moles - Recommend daily use of broad spectrum spf 30+ sunscreen to sun-exposed areas.   FAMILY HISTORY OF SKIN CANCER What type(s):melanoma Who affected:grandfather  NEOPLASM OF UNCERTAIN BEHAVIOR OF SKIN (4) R spinal mid lower back - Epidermal / dermal  shaving  Lesion diameter (cm):  0.5 Informed consent: discussed and consent obtained   Patient was prepped and draped in usual sterile fashion: Area prepped with alcohol. Anesthesia: the lesion was anesthetized in a standard fashion   Anesthetic:  1% lidocaine  w/ epinephrine 1-100,000 buffered w/ 8.4% NaHCO3 Instrument used: flexible razor blade   Hemostasis achieved with: pressure, aluminum chloride and electrodesiccation   Outcome: patient tolerated procedure well   Post-procedure details: wound care instructions given   Post-procedure details comment:  Ointment and small bandage applied  Specimen 1 - Surgical pathology Differential Diagnosis: Irritated Nevus vs other Check Margins: No L spinal lower back - Epidermal / dermal shaving  Lesion diameter (cm):  0.5 Informed consent: discussed and consent obtained   Patient was prepped and draped in usual sterile fashion: Area prepped with alcohol. Anesthesia: the lesion was anesthetized in a standard fashion   Anesthetic:  1% lidocaine  w/ epinephrine 1-100,000 buffered w/ 8.4% NaHCO3 Instrument used: flexible razor blade   Hemostasis achieved with: pressure, aluminum chloride and electrodesiccation   Outcome: patient tolerated procedure well   Post-procedure details: wound care instructions given   Post-procedure details comment:  Ointment and small bandage applied  Specimen 2 - Surgical pathology Differential Diagnosis: Irritated Nevus vs other Check Margins: No L lateral neck - Epidermal / dermal shaving  Lesion diameter (cm):  0.6 Informed consent: discussed and consent obtained   Patient was prepped and draped in usual sterile fashion: Area prepped  with alcohol. Anesthesia: the lesion was anesthetized in a standard fashion   Anesthetic:  1% lidocaine  w/ epinephrine 1-100,000 buffered w/ 8.4% NaHCO3 Instrument used: flexible razor blade   Hemostasis achieved with: pressure, aluminum chloride and electrodesiccation    Outcome: patient tolerated procedure well   Post-procedure details: wound care instructions given   Post-procedure details comment:  Ointment and small bandage applied  Specimen 3 - Surgical pathology Differential Diagnosis: Irritated Nevus vs other Check Margins: No L posterior neck - Epidermal / dermal shaving  Lesion diameter (cm):  0.4 Informed consent: discussed and consent obtained   Patient was prepped and draped in usual sterile fashion: Area prepped with alcohol. Anesthesia: the lesion was anesthetized in a standard fashion   Anesthetic:  1% lidocaine  w/ epinephrine 1-100,000 buffered w/ 8.4% NaHCO3 Instrument used: flexible razor blade   Hemostasis achieved with: pressure, aluminum chloride and electrodesiccation   Outcome: patient tolerated procedure well   Post-procedure details: wound care instructions given   Post-procedure details comment:  Ointment and small bandage applied  Specimen 4 - Surgical pathology Differential Diagnosis: Irritated Nevus vs other Check Margins: No Symptomatic, irritating, patient would like treated.  Return in about 1 year (around 05/24/2025) for TBSE.  IAndrea Kerns, CMA, am acting as scribe for Rexene Rattler, MD .   Documentation: I have reviewed the above documentation for accuracy and completeness, and I agree with the above.  Rexene Rattler, MD    "

## 2024-05-28 LAB — SURGICAL PATHOLOGY

## 2024-05-29 ENCOUNTER — Ambulatory Visit: Payer: Self-pay | Admitting: Dermatology

## 2024-05-29 NOTE — Telephone Encounter (Signed)
-----   Message from Rexene Rattler, MD sent at 05/29/2024 10:09 AM EST ----- 1. Skin, R spinal mid lower back :       MELANOCYTIC NEVUS, INTRADERMAL TYPE  2. Skin, L spinal lower back :       MELANOCYTIC NEVUS, INTRADERMAL TYPE  3. Skin, L lateral neck :       MELANOCYTIC NEVUS, INTRADERMAL TYPE  4. Skin, L posterior neck :       MELANOCYTIC NEVUS, INTRADERMAL TYPE   1-4 benign moles - please call patient

## 2024-05-29 NOTE — Telephone Encounter (Signed)
 Left message for patient advising all 4 biopsies were benign and no further treatment needed.

## 2025-05-27 ENCOUNTER — Encounter: Admitting: Dermatology
